# Patient Record
Sex: Female | Born: 1970 | Race: White | Smoking: Never smoker
Health system: Southern US, Community
[De-identification: ages and names within clinical notes are randomized; demographics above are authoritative.]

## PROBLEM LIST (undated history)

## (undated) DIAGNOSIS — R778 Other specified abnormalities of plasma proteins: Secondary | ICD-10-CM

## (undated) DIAGNOSIS — Z955 Presence of coronary angioplasty implant and graft: Secondary | ICD-10-CM

## (undated) DIAGNOSIS — R079 Chest pain, unspecified: Secondary | ICD-10-CM

## (undated) DIAGNOSIS — J45909 Unspecified asthma, uncomplicated: Secondary | ICD-10-CM

## (undated) DIAGNOSIS — R42 Dizziness and giddiness: Secondary | ICD-10-CM

## (undated) DIAGNOSIS — I2510 Atherosclerotic heart disease of native coronary artery without angina pectoris: Secondary | ICD-10-CM

## (undated) DIAGNOSIS — R0601 Orthopnea: Secondary | ICD-10-CM

## (undated) DIAGNOSIS — I252 Old myocardial infarction: Secondary | ICD-10-CM

## (undated) DIAGNOSIS — I219 Acute myocardial infarction, unspecified: Secondary | ICD-10-CM

## (undated) DIAGNOSIS — R0600 Dyspnea, unspecified: Secondary | ICD-10-CM

## (undated) DIAGNOSIS — B001 Herpesviral vesicular dermatitis: Secondary | ICD-10-CM

## (undated) DIAGNOSIS — I213 ST elevation (STEMI) myocardial infarction of unspecified site: Secondary | ICD-10-CM

## (undated) DIAGNOSIS — R600 Localized edema: Secondary | ICD-10-CM

## (undated) DIAGNOSIS — G43909 Migraine, unspecified, not intractable, without status migrainosus: Secondary | ICD-10-CM

## (undated) DIAGNOSIS — F329 Major depressive disorder, single episode, unspecified: Secondary | ICD-10-CM

## (undated) DIAGNOSIS — R7989 Other specified abnormal findings of blood chemistry: Secondary | ICD-10-CM

## (undated) DIAGNOSIS — O009 Unspecified ectopic pregnancy without intrauterine pregnancy: Secondary | ICD-10-CM

## (undated) DIAGNOSIS — R0602 Shortness of breath: Secondary | ICD-10-CM

## (undated) DIAGNOSIS — F32A Depression, unspecified: Secondary | ICD-10-CM

## (undated) HISTORY — DX: Atherosclerotic heart disease of native coronary artery without angina pectoris: I25.10

## (undated) HISTORY — PX: CORONARY ANGIOPLASTY WITH STENT PLACEMENT: SHX49

## (undated) HISTORY — DX: Dizziness and giddiness: R42

## (undated) HISTORY — DX: Other specified abnormal findings of blood chemistry: R79.89

## (undated) HISTORY — DX: Localized edema: R60.0

## (undated) HISTORY — DX: Shortness of breath: R06.02

## (undated) HISTORY — DX: Orthopnea: R06.01

## (undated) HISTORY — DX: Old myocardial infarction: I25.2

## (undated) HISTORY — DX: Presence of coronary angioplasty implant and graft: Z95.5

## (undated) HISTORY — DX: Other specified abnormalities of plasma proteins: R77.8

## (undated) HISTORY — DX: Dyspnea, unspecified: R06.00

## (undated) HISTORY — DX: Chest pain, unspecified: R07.9

## (undated) HISTORY — PX: CARDIAC CATHETERIZATION: SHX172

---
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
## 2015-07-13 DIAGNOSIS — I213 ST elevation (STEMI) myocardial infarction of unspecified site: Secondary | ICD-10-CM

## 2015-07-13 HISTORY — DX: ST elevation (STEMI) myocardial infarction of unspecified site: I21.3

## 2018-09-15 ENCOUNTER — Ambulatory Visit: Payer: Self-pay | Admitting: Physician Assistant

## 2018-09-15 VITALS — BP 112/72 | HR 83 | Temp 97.9°F | Resp 14 | Wt 158.8 lb

## 2018-09-15 DIAGNOSIS — J209 Acute bronchitis, unspecified: Secondary | ICD-10-CM

## 2018-09-15 DIAGNOSIS — J0100 Acute maxillary sinusitis, unspecified: Secondary | ICD-10-CM

## 2018-09-15 MED ORDER — PREDNISONE 20 MG PO TABS: 40.0000 mg | ORAL_TABLET | Freq: Every day | ORAL | 0 refills | Status: AC

## 2018-09-15 MED ORDER — BENZONATATE 100 MG PO CAPS: 100.0000 mg | ORAL_CAPSULE | Freq: Three times a day (TID) | ORAL | 0 refills | Status: DC | PRN

## 2018-09-15 MED ORDER — DOXYCYCLINE HYCLATE 100 MG PO TABS: 100.0000 mg | ORAL_TABLET | Freq: Two times a day (BID) | ORAL | 0 refills | Status: DC

## 2018-09-15 NOTE — Progress Notes (Signed)
MRN: [id number] DOB: 03/01/1971  Subjective:   Patricia Ali is a 48 y.o. female presenting for chief complaint of productive cough green mucous, sinus pressure and bilateral (x1 week (sudafed, theraflu and allegra)) .  Reports 1 week history of worsening illness. Has sinus pressure/pain, greenish-yellow nasal drainage, ear fullness, and cough.  Cough is mix of productive and dry.  Has had some wheezing intermittently-having to use her rescue inhaler more over the past week.  This provides relief.  Has some shortness of breath when she is coughing.  Has had intermittent fever and chills.  Denies myalgias, visual disturbance, confusion, gait abnormality, sore throat, inability to swallow and chest pain, nausea, vomiting, abdominal pain and diarrhea.  Has tried daily allergy medicine, asthma inhalers, real Sudafed, and TheraFlu.  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 Patient has had flu shot this season. Denies smoking.  Denies recent travel.   PMH of MI in 2016-no stents; asthma-on daily breo ellipta, rescue inhaler prn; and seasonal allergies-daily allegra and Astelin. Last hospitalized for asthma exacerbation >1 yr ago.  Denies PMH of diabetes, heart failure, and autoimmune disease.  PSH of uterine ablation. Denies any other aggravating or relieving factors, no other questions or concerns.  Review of Systems  Constitutional: Negative for diaphoresis.  Cardiovascular: Negative for palpitations and leg swelling.  Musculoskeletal: Negative for neck pain.  Skin: Negative for rash.  Neurological: Positive for headaches.    Jenevie has a current medication list which includes the following prescription(s): bupropion, fluticasone furoate-vilanterol, topiramate, and valacyclovir. Also is allergic to amoxicillin and augmentin [amoxicillin-pot clavulanate].  Tiawanna  has no past medical history on file. Also  has no past surgical history on file.   Objective:   Vitals: BP 112/72    Pulse 83   Temp 97.9 F (36.6 C)   Resp 14   Wt 158 lb 12.8 oz (72 kg)   SpO2 98%   Physical Exam Vitals signs reviewed.  Constitutional:      General: She is not in acute distress.    Appearance: She is well-developed. She is not toxic-appearing.  HENT:     Head: Normocephalic and atraumatic.     Right Ear: Ear canal and external ear normal. A middle ear effusion is present. Tympanic membrane is not erythematous or bulging.     Left Ear: Ear canal and external ear normal. A middle ear effusion is present. Tympanic membrane is not erythematous or bulging.     Nose: Mucosal edema (decreased nasal patancy b/l) and congestion present.     Right Sinus: Maxillary sinus tenderness present. No frontal sinus tenderness.     Left Sinus: Maxillary sinus tenderness present. No frontal sinus tenderness.     Mouth/Throat:     Lips: Pink.     Mouth: Mucous membranes are moist.     Pharynx: Uvula midline. Posterior oropharyngeal erythema present. No pharyngeal swelling, oropharyngeal exudate or uvula swelling.     Tonsils: No tonsillar exudate or tonsillar abscesses. Swelling: 1+ on the right. 1+ on the left.     Comments: Hoarse voice noted.  Eyes:     Extraocular Movements: Extraocular movements intact.     Conjunctiva/sclera: Conjunctivae normal.     Pupils: Pupils are equal, round, and reactive to light.  Neck:     Musculoskeletal: Normal range of motion.     Trachea: No abnormal tracheal secretions.  Cardiovascular:     Rate and Rhythm: Normal rate and regular rhythm.  Heart sounds: Murmur present. Systolic murmur present.  Pulmonary:     Effort: Pulmonary effort is normal. No tachypnea, accessory muscle usage, respiratory distress or retractions.     Breath sounds: No stridor. Wheezing (one faint wheeze w/ forced exp breathing in LLL) present. No decreased breath sounds, rhonchi or rales.  Musculoskeletal:     Right lower leg: She exhibits no swelling.     Left lower leg: She  exhibits no swelling.  Lymphadenopathy:     Head:     Right side of head: No submental, submandibular, tonsillar, preauricular, posterior auricular or occipital adenopathy.     Left side of head: No submental, submandibular, tonsillar, preauricular, posterior auricular or occipital adenopathy.     Cervical: No cervical adenopathy.     Upper Body:     Right upper body: No supraclavicular adenopathy.     Left upper body: No supraclavicular adenopathy.  Skin:    General: Skin is warm and dry.  Neurological:     Mental Status: She is alert.     No results found for this or any previous visit (from the past 24 hour(s)).  Assessment and Plan :  1. Acute maxillary sinusitis, recurrence not specified 2. Acute bronchitis with bronchospasm Patient is overall well-appearing, no acute distress.  VSS.  She is afebrile. No tachycardia.SPO2 98%. No signs of respiratory distress.  No tachypnea, stridor, nasal flaring, accessory muscle use, or retractions.  One faint wheeze auscultated on lung exam with forced expiratory breathing otherwise lung sounds were clear.  No crackles or rhonchi.  Has bilateral TTP sinuses and mucosal edema.  Due to history, duration, and no improvement with conservative management, would recommend treating with oral antibiotics at this time.  Will also provide prescription for prednisone due to history and physical exam findings. Would suggest she follow-up with primary care doctor if no improvement in the next 7 to 10 days with treatment plan.  Encouraged to seek care sooner if any of her current symptoms worsen with treatment plan or she develops new concerning symptoms.  Patient voices her understanding. - predniSONE (DELTASONE) 20 MG tablet; Take 2 tablets (40 mg total) by mouth daily with breakfast for 5 days.  Dispense: 10 tablet; Refill: 0 - doxycycline (VIBRA-TABS) 100 MG tablet; Take 1 tablet (100 mg total) by mouth 2 (two) times daily.  Dispense: 20 tablet; Refill: 0 -  benzonatate (TESSALON) 100 MG capsule; Take 1-2 capsules (100-200 mg total) by mouth 3 (three) times daily as needed for cough.  Dispense: 40 capsule; Refill: 0   Benjiman Core, Cordelia Poche  [facility] 09/15/2018 8:32 AM

## 2018-09-15 NOTE — Patient Instructions (Signed)
Thank you for choosing [facility] for your health care needs.   You have been diagnosed with bronchitis (chest cold) with sinusitis.   Due to duration, we are going to treat with oral antibiotics and also with prednisone.   While taking Doxycycline:  -Do not drink milk or take iron supplements, multivitamins, calcium supplements, antacids, laxatives within 2 hours before or after taking doxycycline. -Avoid direct exposure to sunlight or tanning beds. Doxycycline can make you sunburn more easily. Wear protective clothing and use sunscreen (SPF 30 or higher) when you are outdoors. -Antibiotic medicines can cause diarrhea, which may be a sign of a new infection. If you have diarrhea that is watery or bloody, stop taking this medicine and seek medical care.    Prednisone is a steroid and can cause side effects such as headache, irritability, nausea, vomiting, increased heart rate, increased blood pressure, increased blood sugar, appetite changes, and insomnia. Please take tablets in the morning with a full meal to help decrease the chances of these side effects.     Recommend increase fluids; water, Gatorade, or hot tea with water or lemon. May use humidifier or vaporizer in bedroom. Use several pillows to prop self up at night, may help with coughing and sleeping. Rest.   Use your rescue inhaler every 4-6 hours (when awake) for cough/wheezing.  Hope you feel better soon.   Follow-up with family physician or local urgent care if not seeing any improvement in the next 48 hours. Seek care sooner with any worsening symptoms.   Sinusitis, Adult Sinusitis is soreness and swelling (inflammation) of your sinuses. Sinuses are hollow spaces in the bones around your face. They are located:  Around your eyes.  In the middle of your forehead.  Behind your nose.  In your cheekbones. Your sinuses and nasal passages are lined with a fluid called mucus. Mucus drains out of your sinuses. Swelling can  trap mucus in your sinuses. This lets germs (bacteria, virus, or fungus) grow, which leads to infection. Most of the time, this condition is caused by a virus. What are the causes? This condition is caused by:  Allergies.  Asthma.  Germs.  Things that block your nose or sinuses.  Growths in the nose (nasal polyps).  Chemicals or irritants in the air.  Fungus (rare). What increases the risk? You are more likely to develop this condition if:  You have a weak body defense system (immune system).  You do a lot of swimming or diving.  You use nasal sprays too much.  You smoke. What are the signs or symptoms? The main symptoms of this condition are pain and a feeling of pressure around the sinuses. Other symptoms include:  Stuffy nose (congestion).  Runny nose (drainage).  Swelling and warmth in the sinuses.  Headache.  Toothache.  A cough that may get worse at night.  Mucus that collects in the throat or the back of the nose (postnasal drip).  Being unable to smell and taste.  Being very tired (fatigue).  A fever.  Sore throat.  Bad breath. How is this diagnosed? This condition is diagnosed based on:  Your symptoms.  Your medical history.  A physical exam.  Tests to find out if your condition is short-term (acute) or long-term (chronic). Your doctor may: ? Check your nose for growths (polyps). ? Check your sinuses using a tool that has a light (endoscope). ? Check for allergies or germs. ? Do imaging tests, such as an MRI or CT scan.  How is this treated? Treatment for this condition depends on the cause and whether it is short-term or long-term.  If caused by a virus, your symptoms should go away on their own within 10 days. You may be given medicines to relieve symptoms. They include: ? Medicines that shrink swollen tissue in the nose. ? Medicines that treat allergies (antihistamines). ? A spray that treats swelling of the nostrils. ? Rinses that  help get rid of thick mucus in your nose (nasal saline washes).  If caused by bacteria, your doctor may wait to see if you will get better without treatment. You may be given antibiotic medicine if you have: ? A very bad infection. ? A weak body defense system.  If caused by growths in the nose, you may need to have surgery. Follow these instructions at home: Medicines  Take, use, or apply over-the-counter and prescription medicines only as told by your doctor. These may include nasal sprays.  If you were prescribed an antibiotic medicine, take it as told by your doctor. Do not stop taking the antibiotic even if you start to feel better. Hydrate and humidify   Drink enough water to keep your pee (urine) pale yellow.  Use a cool mist humidifier to keep the humidity level in your home above 50%.  Breathe in steam for 10-15 minutes, 3-4 times a day, or as told by your doctor. You can do this in the bathroom while a hot shower is running.  Try not to spend time in cool or dry air. Rest  Rest as much as you can.  Sleep with your head raised (elevated).  Make sure you get enough sleep each night. General instructions   Put a warm, moist washcloth on your face 3-4 times a day, or as often as told by your doctor. This will help with discomfort.  Wash your hands often with soap and water. If there is no soap and water, use hand sanitizer.  Do not smoke. Avoid being around people who are smoking (secondhand smoke).  Keep all follow-up visits as told by your doctor. This is important. Contact a doctor if:  You have a fever.  Your symptoms get worse.  Your symptoms do not get better within 10 days. Get help right away if:  You have a very bad headache.  You cannot stop throwing up (vomiting).  You have very bad pain or swelling around your face or eyes.  You have trouble seeing.  You feel confused.  Your neck is stiff.  You have trouble  breathing. Summary  Sinusitis is swelling of your sinuses. Sinuses are hollow spaces in the bones around your face.  This condition is caused by tissues in your nose that become inflamed or swollen. This traps germs. These can lead to infection.  If you were prescribed an antibiotic medicine, take it as told by your doctor. Do not stop taking it even if you start to feel better.  Keep all follow-up visits as told by your doctor. This is important. This information is not intended to replace advice given to you by your health care provider. Make sure you discuss any questions you have with your health care provider. Document Released: 12/15/2007 Document Revised: 11/28/2017 Document Reviewed: 11/28/2017 Elsevier Interactive Patient Education  2019 [employer]   Acute Bronchitis, Adult Acute bronchitis is when air tubes (bronchi) in the lungs suddenly get swollen. The condition can make it hard to breathe. It can also cause these symptoms:  A cough.  Coughing up clear, yellow, or green mucus.  Wheezing.  Chest congestion.  Shortness of breath.  A fever.  Body aches.  Chills.  A sore throat. Follow these instructions at home:  Medicines  Take over-the-counter and prescription medicines only as told by your doctor.  If you were prescribed an antibiotic medicine, take it as told by your doctor. Do not stop taking the antibiotic even if you start to feel better. General instructions  Rest.  Drink enough fluids to keep your pee (urine) pale yellow.  Avoid smoking and secondhand smoke. If you smoke and you need help quitting, ask your doctor. Quitting will help your lungs heal faster.  Use an inhaler, cool mist vaporizer, or humidifier as told by your doctor.  Keep all follow-up visits as told by your doctor. This is important. How is this prevented? To lower your risk of getting this condition again:  Wash your hands often with soap and water. If you cannot use  soap and water, use hand sanitizer.  Avoid contact with people who have cold symptoms.  Try not to touch your hands to your mouth, nose, or eyes.  Make sure to get the flu shot every year. Contact a doctor if:  Your symptoms do not get better in 2 weeks. Get help right away if:  You cough up blood.  You have chest pain.  You have very bad shortness of breath.  You become dehydrated.  You faint (pass out) or keep feeling like you are going to pass out.  You keep throwing up (vomiting).  You have a very bad headache.  Your fever or chills gets worse. This information is not intended to replace advice given to you by your health care provider. Make sure you discuss any questions you have with your health care provider. Document Released: 12/15/2007 Document Revised: 02/09/2017 Document Reviewed: 12/17/2015 Elsevier Interactive Patient Education  2019 [employer].

## 2019-02-17 ENCOUNTER — Other Ambulatory Visit: Payer: Self-pay

## 2019-02-17 ENCOUNTER — Emergency Department (HOSPITAL_COMMUNITY): Payer: BC Managed Care – PPO

## 2019-02-17 ENCOUNTER — Encounter (HOSPITAL_COMMUNITY): Payer: Self-pay

## 2019-02-17 ENCOUNTER — Observation Stay (HOSPITAL_COMMUNITY)
Admission: EM | Admit: 2019-02-17 | Discharge: 2019-02-18 | Disposition: A | Discharge status: Home / Self Care | Payer: BC Managed Care – PPO | Attending: Family Medicine | Admitting: Family Medicine

## 2019-02-17 DIAGNOSIS — G43909 Migraine, unspecified, not intractable, without status migrainosus: Secondary | ICD-10-CM | POA: Diagnosis not present

## 2019-02-17 DIAGNOSIS — I2510 Atherosclerotic heart disease of native coronary artery without angina pectoris: Secondary | ICD-10-CM | POA: Diagnosis not present

## 2019-02-17 DIAGNOSIS — Z20828 Contact with and (suspected) exposure to other viral communicable diseases: Secondary | ICD-10-CM | POA: Diagnosis not present

## 2019-02-17 DIAGNOSIS — R079 Chest pain, unspecified: Secondary | ICD-10-CM | POA: Diagnosis present

## 2019-02-17 DIAGNOSIS — R200 Anesthesia of skin: Secondary | ICD-10-CM | POA: Diagnosis not present

## 2019-02-17 DIAGNOSIS — F32A Depression, unspecified: Secondary | ICD-10-CM | POA: Diagnosis present

## 2019-02-17 DIAGNOSIS — Z79899 Other long term (current) drug therapy: Secondary | ICD-10-CM | POA: Insufficient documentation

## 2019-02-17 DIAGNOSIS — R7309 Other abnormal glucose: Secondary | ICD-10-CM | POA: Diagnosis not present

## 2019-02-17 DIAGNOSIS — F329 Major depressive disorder, single episode, unspecified: Secondary | ICD-10-CM | POA: Insufficient documentation

## 2019-02-17 DIAGNOSIS — R0789 Other chest pain: Principal | ICD-10-CM | POA: Insufficient documentation

## 2019-02-17 DIAGNOSIS — J45909 Unspecified asthma, uncomplicated: Secondary | ICD-10-CM | POA: Diagnosis present

## 2019-02-17 HISTORY — DX: Acute myocardial infarction, unspecified: I21.9

## 2019-02-17 HISTORY — DX: Unspecified ectopic pregnancy without intrauterine pregnancy: O00.90

## 2019-02-17 HISTORY — DX: Depression, unspecified: F32.A

## 2019-02-17 HISTORY — DX: Herpesviral vesicular dermatitis: B00.1

## 2019-02-17 HISTORY — DX: Unspecified asthma, uncomplicated: J45.909

## 2019-02-17 HISTORY — DX: Migraine, unspecified, not intractable, without status migrainosus: G43.909

## 2019-02-17 LAB — CBC
HCT: 39.2 % (ref 36.0–46.0)
Hemoglobin: 13.9 g/dL (ref 12.0–15.0)
MCH: 32.3 pg (ref 26.0–34.0)
MCHC: 35.5 g/dL (ref 30.0–36.0)
MCV: 91 fL (ref 80.0–100.0)
Platelets: 208 10*3/uL (ref 150–400)
RBC: 4.31 MIL/uL (ref 3.87–5.11)
RDW: 12.3 % (ref 11.5–15.5)
WBC: 7.8 10*3/uL (ref 4.0–10.5)
nRBC: 0 % (ref 0.0–0.2)

## 2019-02-17 LAB — BASIC METABOLIC PANEL
Anion gap: 11 (ref 5–15)
BUN: 24 mg/dL — ABNORMAL HIGH (ref 6–20)
CO2: 24 mmol/L (ref 22–32)
Calcium: 9.3 mg/dL (ref 8.9–10.3)
Chloride: 105 mmol/L (ref 98–111)
Creatinine, Ser: 1.1 mg/dL — ABNORMAL HIGH (ref 0.44–1.00)
GFR calc Af Amer: >60 mL/min (ref >60)
GFR calc non Af Amer: 58 mL/min — ABNORMAL LOW (ref >60)
Glucose, Bld: 151 mg/dL — ABNORMAL HIGH (ref 70–99)
Potassium: 3.9 mmol/L (ref 3.5–5.1)
Sodium: 140 mmol/L (ref 135–145)

## 2019-02-17 LAB — TROPONIN I (HIGH SENSITIVITY)
Troponin I (High Sensitivity): 3 ng/L (ref <18)
Troponin I (High Sensitivity): 3 ng/L (ref <18)

## 2019-02-17 LAB — I-STAT BETA HCG BLOOD, ED (MC, WL, AP ONLY): I-stat hCG, quantitative: <5 m[IU]/mL (ref <5)

## 2019-02-17 MED ORDER — SODIUM CHLORIDE 0.9% FLUSH: 3.0000 mL | Freq: Once | INTRAVENOUS | Status: DC

## 2019-02-17 NOTE — ED Triage Notes (Signed)
Pt comes for CP that started today that woke her from her sleep, radiation to R arm with nausea and headache, pt reports got worse about an hour ago, reports pain now radiates to arm and is now numb and tingling in R arm. Neuro intact bilaterally. Hx of MI

## 2019-02-17 NOTE — ED Notes (Signed)
Pt taken back to obtain 2nd trop.

## 2019-02-17 NOTE — ED Notes (Signed)
Spoke with MD Alvino Chapel about R arm numbness and tingling, no head CT at this time

## 2019-02-18 ENCOUNTER — Observation Stay (HOSPITAL_BASED_OUTPATIENT_CLINIC_OR_DEPARTMENT_OTHER): Payer: BC Managed Care – PPO

## 2019-02-18 ENCOUNTER — Other Ambulatory Visit: Payer: Self-pay

## 2019-02-18 ENCOUNTER — Encounter (HOSPITAL_COMMUNITY): Payer: Self-pay | Admitting: Internal Medicine

## 2019-02-18 ENCOUNTER — Other Ambulatory Visit: Payer: Self-pay | Admitting: Student

## 2019-02-18 DIAGNOSIS — R202 Paresthesia of skin: Secondary | ICD-10-CM

## 2019-02-18 DIAGNOSIS — R079 Chest pain, unspecified: Secondary | ICD-10-CM | POA: Diagnosis not present

## 2019-02-18 DIAGNOSIS — J4520 Mild intermittent asthma, uncomplicated: Secondary | ICD-10-CM | POA: Diagnosis not present

## 2019-02-18 DIAGNOSIS — F32A Depression, unspecified: Secondary | ICD-10-CM | POA: Diagnosis present

## 2019-02-18 DIAGNOSIS — G43909 Migraine, unspecified, not intractable, without status migrainosus: Secondary | ICD-10-CM | POA: Diagnosis present

## 2019-02-18 DIAGNOSIS — F329 Major depressive disorder, single episode, unspecified: Secondary | ICD-10-CM | POA: Diagnosis present

## 2019-02-18 DIAGNOSIS — Z20828 Contact with and (suspected) exposure to other viral communicable diseases: Secondary | ICD-10-CM | POA: Diagnosis not present

## 2019-02-18 DIAGNOSIS — R0789 Other chest pain: Secondary | ICD-10-CM | POA: Diagnosis not present

## 2019-02-18 DIAGNOSIS — R200 Anesthesia of skin: Secondary | ICD-10-CM | POA: Diagnosis present

## 2019-02-18 DIAGNOSIS — J45909 Unspecified asthma, uncomplicated: Secondary | ICD-10-CM | POA: Diagnosis not present

## 2019-02-18 DIAGNOSIS — I2510 Atherosclerotic heart disease of native coronary artery without angina pectoris: Secondary | ICD-10-CM | POA: Diagnosis not present

## 2019-02-18 LAB — HIV ANTIBODY (ROUTINE TESTING W REFLEX): HIV Screen 4th Generation wRfx: NONREACTIVE

## 2019-02-18 LAB — NM MYOCAR MULTI W/SPECT W/WALL MOTION / EF
Estimated workload: 1 METS
Exercise duration (min): 0 min
Exercise duration (sec): 0 s
MPHR: 170 {beats}/min
Peak HR: 85 {beats}/min
Percent HR: 50 %
Rest HR: 52 {beats}/min

## 2019-02-18 LAB — TROPONIN I (HIGH SENSITIVITY)
Troponin I (High Sensitivity): 4 ng/L (ref <18)
Troponin I (High Sensitivity): 4 ng/L (ref <18)
Troponin I (High Sensitivity): 3 ng/L (ref <18)

## 2019-02-18 LAB — RAPID URINE DRUG SCREEN, HOSP PERFORMED
Amphetamines: NOT DETECTED
Barbiturates: NOT DETECTED
Benzodiazepines: NOT DETECTED
Cocaine: NOT DETECTED
Opiates: POSITIVE — AB
Tetrahydrocannabinol: NOT DETECTED

## 2019-02-18 LAB — LIPID PANEL
Cholesterol: 257 mg/dL — ABNORMAL HIGH (ref 0–200)
HDL: 51 mg/dL (ref >40)
LDL Cholesterol: 179 mg/dL — ABNORMAL HIGH (ref 0–99)
Total CHOL/HDL Ratio: 5 RATIO
Triglycerides: 133 mg/dL (ref <150)
VLDL: 27 mg/dL (ref 0–40)

## 2019-02-18 LAB — HEMOGLOBIN A1C
Hgb A1c MFr Bld: 6 % — ABNORMAL HIGH (ref 4.8–5.6)
Mean Plasma Glucose: 125.5 mg/dL

## 2019-02-18 LAB — D-DIMER, QUANTITATIVE: D-Dimer, Quant: <0.27 ug/mL-FEU (ref 0.00–0.50)

## 2019-02-18 LAB — SARS CORONAVIRUS 2 BY RT PCR (HOSPITAL ORDER, PERFORMED IN ~~LOC~~ HOSPITAL LAB): SARS Coronavirus 2: NEGATIVE

## 2019-02-18 MED ORDER — MORPHINE SULFATE (PF) 2 MG/ML IV SOLN
2.0000 mg | INTRAVENOUS | Status: DC | PRN
  Administered 2019-02-18 (×2): 2 mg via INTRAVENOUS
  Filled 2019-02-18 (×2): qty 1

## 2019-02-18 MED ORDER — ASPIRIN 81 MG PO CHEW
324.0000 mg | CHEWABLE_TABLET | Freq: Once | ORAL | Status: AC
  Administered 2019-02-18: 324 mg via ORAL
  Filled 2019-02-18: qty 4

## 2019-02-18 MED ORDER — ESCITALOPRAM OXALATE 10 MG PO TABS
20.0000 mg | ORAL_TABLET | Freq: Every day | ORAL | Status: DC
  Administered 2019-02-18: 20 mg via ORAL
  Filled 2019-02-18: qty 2

## 2019-02-18 MED ORDER — NITROGLYCERIN 0.4 MG SL SUBL: 0.4000 mg | SUBLINGUAL_TABLET | SUBLINGUAL | 0 refills | Status: AC | PRN

## 2019-02-18 MED ORDER — NITROGLYCERIN 0.4 MG SL SUBL
0.4000 mg | SUBLINGUAL_TABLET | SUBLINGUAL | Status: DC | PRN
  Administered 2019-02-18 (×2): 0.4 mg via SUBLINGUAL
  Filled 2019-02-18: qty 1

## 2019-02-18 MED ORDER — MOMETASONE FURO-FORMOTEROL FUM 200-5 MCG/ACT IN AERO
2.0000 | INHALATION_SPRAY | Freq: Two times a day (BID) | RESPIRATORY_TRACT | Status: DC
  Filled 2019-02-18: qty 8.8

## 2019-02-18 MED ORDER — TECHNETIUM TC 99M TETROFOSMIN IV KIT
30.0000 | PACK | Freq: Once | INTRAVENOUS | Status: AC | PRN
  Administered 2019-02-18: 30 via INTRAVENOUS

## 2019-02-18 MED ORDER — KETOROLAC TROMETHAMINE 30 MG/ML IJ SOLN
30.0000 mg | Freq: Once | INTRAMUSCULAR | Status: AC
  Administered 2019-02-18: 30 mg via INTRAVENOUS
  Filled 2019-02-18: qty 1

## 2019-02-18 MED ORDER — ALBUTEROL SULFATE (2.5 MG/3ML) 0.083% IN NEBU: 3.0000 mL | INHALATION_SOLUTION | Freq: Four times a day (QID) | RESPIRATORY_TRACT | Status: DC | PRN

## 2019-02-18 MED ORDER — VALACYCLOVIR HCL 500 MG PO TABS
500.0000 mg | ORAL_TABLET | Freq: Every day | ORAL | Status: DC
  Administered 2019-02-18: 500 mg via ORAL
  Filled 2019-02-18: qty 1

## 2019-02-18 MED ORDER — ENOXAPARIN SODIUM 40 MG/0.4ML ~~LOC~~ SOLN
40.0000 mg | SUBCUTANEOUS | Status: DC
  Administered 2019-02-18: 40 mg via SUBCUTANEOUS
  Filled 2019-02-18: qty 0.4

## 2019-02-18 MED ORDER — MORPHINE SULFATE (PF) 4 MG/ML IV SOLN
4.0000 mg | Freq: Once | INTRAVENOUS | Status: AC
  Administered 2019-02-18: 4 mg via INTRAVENOUS
  Filled 2019-02-18: qty 1

## 2019-02-18 MED ORDER — ACETAMINOPHEN 325 MG PO TABS: 650.0000 mg | ORAL_TABLET | ORAL | Status: DC | PRN

## 2019-02-18 MED ORDER — TECHNETIUM TC 99M TETROFOSMIN IV KIT
10.0000 | PACK | Freq: Once | INTRAVENOUS | Status: AC | PRN
  Administered 2019-02-18: 10 via INTRAVENOUS

## 2019-02-18 MED ORDER — ASPIRIN 81 MG PO CHEW
324.0000 mg | CHEWABLE_TABLET | Freq: Every day | ORAL | Status: DC
  Administered 2019-02-18: 324 mg via ORAL
  Filled 2019-02-18 (×2): qty 4

## 2019-02-18 MED ORDER — REGADENOSON 0.4 MG/5ML IV SOLN
INTRAVENOUS | Status: AC
  Filled 2019-02-18: qty 5

## 2019-02-18 MED ORDER — REGADENOSON 0.4 MG/5ML IV SOLN
0.4000 mg | Freq: Once | INTRAVENOUS | Status: AC
  Administered 2019-02-18: 0.4 mg via INTRAVENOUS
  Filled 2019-02-18: qty 5

## 2019-02-18 MED ORDER — ONDANSETRON HCL 4 MG/2ML IJ SOLN: 4.0000 mg | Freq: Four times a day (QID) | INTRAMUSCULAR | Status: DC | PRN

## 2019-02-18 MED ORDER — SODIUM CHLORIDE 0.9 % IV SOLN
INTRAVENOUS | Status: DC
  Administered 2019-02-18: 05:00:00 via INTRAVENOUS

## 2019-02-18 MED ORDER — REGADENOSON 0.4 MG/5ML IV SOLN
0.4000 mg | Freq: Once | INTRAVENOUS | Status: DC
  Filled 2019-02-18: qty 5

## 2019-02-18 MED ORDER — TOPIRAMATE 25 MG PO TABS
50.0000 mg | ORAL_TABLET | Freq: Two times a day (BID) | ORAL | Status: DC
  Administered 2019-02-18: 50 mg via ORAL
  Filled 2019-02-18 (×3): qty 2

## 2019-02-18 NOTE — Progress Notes (Signed)
   Patient presented for a nuclear stress test today. No immediate complications. Stress imaging is pending at this time.   Preliminary EKG findings may be listed in the chart, but the stress test result will not be finalized until perfusion imaging is complete.  Darreld Mclean, PA-C 02/18/2019 10:04 AM

## 2019-02-18 NOTE — Plan of Care (Signed)
  Problem: Clinical Measurements: Goal: Ability to maintain clinical measurements within normal limits will improve Outcome: Progressing Goal: Diagnostic test results will improve Outcome: Completed/Met

## 2019-02-18 NOTE — Discharge Instructions (Signed)
Patricia Ali,  You were in the hospital with chest pain. You received testing and exams that were low risk for you having a heart attack. Please follow-up with the cardiologist as recommended.  Heart-Healthy Eating Plan Heart-healthy meal planning includes:  Eating less unhealthy fats.  Eating more healthy fats.  Making other changes in your diet. Talk with your doctor or a diet specialist (dietitian) to create an eating plan that is right for you. What is my plan? Your doctor may recommend an eating plan that includes:  Total fat: ______% or less of total calories a day.  Saturated fat: ______% or less of total calories a day.  Cholesterol: less than _________mg a day. What are tips for following this plan? Cooking Avoid frying your food. Try to bake, boil, grill, or broil it instead. You can also reduce fat by:  Removing the skin from poultry.  Removing all visible fats from meats.  Steaming vegetables in water or broth. Meal planning   At meals, divide your plate into four equal parts: ? Fill one-half of your plate with vegetables and green salads. ? Fill one-fourth of your plate with whole grains. ? Fill one-fourth of your plate with lean protein foods.  Eat 4-5 servings of vegetables per day. A serving of vegetables is: ? 1 cup of raw or cooked vegetables. ? 2 cups of raw leafy greens.  Eat 4-5 servings of fruit per day. A serving of fruit is: ? 1 medium whole fruit. ?  cup of dried fruit. ?  cup of fresh, frozen, or canned fruit. ?  cup of 100% fruit juice.  Eat more foods that have soluble fiber. These are apples, broccoli, carrots, beans, peas, and barley. Try to get 20-30 g of fiber per day.  Eat 4-5 servings of nuts, legumes, and seeds per week: ? 1 serving of dried beans or legumes equals  cup after being cooked. ? 1 serving of nuts is  cup. ? 1 serving of seeds equals 1 tablespoon. General information  Eat more home-cooked food. Eat less  restaurant, buffet, and fast food.  Limit or avoid alcohol.  Limit foods that are high in starch and sugar.  Avoid fried foods.  Lose weight if you are overweight.  Keep track of how much salt (sodium) you eat. This is important if you have high blood pressure. Ask your doctor to tell you more about this.  Try to add vegetarian meals each week. Fats  Choose healthy fats. These include olive oil and canola oil, flaxseeds, walnuts, almonds, and seeds.  Eat more omega-3 fats. These include salmon, mackerel, sardines, tuna, flaxseed oil, and ground flaxseeds. Try to eat fish at least 2 times each week.  Check food labels. Avoid foods with trans fats or high amounts of saturated fat.  Limit saturated fats. ? These are often found in animal products, such as meats, butter, and cream. ? These are also found in plant foods, such as palm oil, palm kernel oil, and coconut oil.  Avoid foods with partially hydrogenated oils in them. These have trans fats. Examples are stick margarine, some tub margarines, cookies, crackers, and other baked goods. What foods can I eat? Fruits All fresh, canned (in natural juice), or frozen fruits. Vegetables Fresh or frozen vegetables (raw, steamed, roasted, or grilled). Green salads. Grains Most grains. Choose whole wheat and whole grains most of the time. Rice and pasta, including brown rice and pastas made with whole wheat. Meats and other proteins Lean, well-trimmed beef,  veal, pork, and lamb. Chicken and [place] without skin. All fish and shellfish. Wild duck, rabbit, pheasant, and venison. Egg whites or low-cholesterol egg substitutes. Dried beans, peas, lentils, and tofu. Seeds and most nuts. Dairy Low-fat or nonfat cheeses, including ricotta and mozzarella. Skim or 1% milk that is liquid, powdered, or evaporated. Buttermilk that is made with low-fat milk. Nonfat or low-fat yogurt. Fats and oils Non-hydrogenated (trans-free) margarines. Vegetable oils,  including soybean, sesame, sunflower, olive, peanut, safflower, corn, canola, and cottonseed. Salad dressings or mayonnaise made with a vegetable oil. Beverages Mineral water. Coffee and tea. Diet carbonated beverages. Sweets and desserts Sherbet, gelatin, and fruit ice. Small amounts of dark chocolate. Limit all sweets and desserts. Seasonings and condiments All seasonings and condiments. The items listed above may not be a complete list of foods and drinks you can eat. Contact a dietitian for more options. What foods should I avoid? Fruits Canned fruit in heavy syrup. Fruit in cream or butter sauce. Fried fruit. Limit coconut. Vegetables Vegetables cooked in cheese, cream, or butter sauce. Fried vegetables. Grains Breads that are made with saturated or trans fats, oils, or whole milk. Croissants. Sweet rolls. Donuts. High-fat crackers, such as cheese crackers. Meats and other proteins Fatty meats, such as hot dogs, ribs, sausage, bacon, rib-eye roast or steak. High-fat deli meats, such as salami and bologna. Caviar. Domestic duck and goose. Organ meats, such as liver. Dairy Cream, sour cream, cream cheese, and creamed cottage cheese. Whole-milk cheeses. Whole or 2% milk that is liquid, evaporated, or condensed. Whole buttermilk. Cream sauce or high-fat cheese sauce. Yogurt that is made from whole milk. Fats and oils Meat fat, or shortening. Cocoa butter, hydrogenated oils, palm oil, coconut oil, palm kernel oil. Solid fats and shortenings, including bacon fat, salt pork, lard, and butter. Nondairy cream substitutes. Salad dressings with cheese or sour cream. Beverages Regular sodas and juice drinks with added sugar. Sweets and desserts Frosting. Pudding. Cookies. Cakes. Pies. Milk chocolate or white chocolate. Buttered syrups. Full-fat ice cream or ice cream drinks. The items listed above may not be a complete list of foods and drinks to avoid. Contact a dietitian for more  information. Summary  Heart-healthy meal planning includes eating less unhealthy fats, eating more healthy fats, and making other changes in your diet.  Eat a balanced diet. This includes fruits and vegetables, low-fat or nonfat dairy, lean protein, nuts and legumes, whole grains, and heart-healthy oils and fats. This information is not intended to replace advice given to you by your health care provider. Make sure you discuss any questions you have with your health care provider. Document Released: 12/28/2011 Document Revised: 09/01/2017 Document Reviewed: 08/05/2017 Elsevier Patient Education  2020 [employer]

## 2019-02-18 NOTE — Discharge Summary (Signed)
Physician Discharge Summary  Patricia Ali ONG:[id number] DOB: May 03, 1971 DOA: 02/17/2019  PCP: Lewis Moccasin, MD  Admit date: 02/17/2019 Discharge date: 02/18/2019  Admitted From: Home Disposition: Home  Recommendations for Outpatient Follow-up:  1. Follow up with PCP in 1 week 2. Follow up with Dr. [place] Emms 3. Outpatient Transthoracic Echocardiogram 4. Please obtain BMP/CBC in one week 5. Please follow up on the following pending results: None  Home Health: None Equipment/Devices: None  Discharge Condition: Stable CODE STATUS: Full code Diet recommendation: Heart healthy   Brief/Interim Summary:  Admission HPI written by Lorretta Harp, MD   Chief Complaint: Chest pain  HPI: Patricia Ali is a 48 y.o. female with medical history significant of CAD and MI 4 years ago (no stent placement), migraine headache, asthma, depression, who presents with chest pain.  Patient states that she was waken up by chest pain at about 3 AM.  The chest pain is located in the right upper chest, initially 9 out of 10 in severity, currently 5 out of 10 in severity, sharp, pleuritic, aggravated by deep breath, radiating to the left arm, associated with diaphoresis.  Patient does not have cough, shortness breath, fever or chills.  No recent long distant traveling.  No tenderness in the calf areas.  Patient states that initially her chest pain was radiating to right arm, causing right arm tingling, which has resolved.  Currently patient does not have any tingling, numbness or weakness in the right arm.  No facial droop or slurred speech.  No vision change or hearing loss.  Patient has  nausea, but no vomiting, diarrhea, abdominal pain.  No symptoms of UTI.  She states that she is taking Valtrex for nose cold sore chronically.  ED Course: pt was found to have negative troponin x2, WBC 7.8, negative pregnancy test, pending COVID-19 test, creatinine 1.10, BUN 24, temperature normal, blood pressure 146/58, heart  rate 52, oxygen saturation 98% on room air, chest x-ray negative.  Patient is placed on telemetry bed for observation.    Hospital course:  Chest pain History of CAD with reported MI in the past. Has had a heart catheterization without PCI. Pain is somewhat reproducible but similar to pain from previous MI. Improved with nitro and morphine. Hs troponin have been negative and flat. No evidence of ACS on repeat EKG, even during episode of chest pain. Cardiology ordered nuclear stress test with was low risk. Recommendations for outpatient follow-up with outpatient Transthoracic Echocardiogram. Discharged with a nitroglycerin prescription.  Discharge Diagnoses:  Principal Problem:   Chest pain Active Problems:   Asthma   CAD (coronary artery disease)   Depression   Numbness and tingling of right arm   Migraine    Discharge Instructions  Discharge Instructions    Diet - low sodium heart healthy   Complete by: As directed    Increase activity slowly   Complete by: As directed      Allergies as of 02/18/2019      Reactions   Amoxicillin Diarrhea   Did it involve swelling of the face/tongue/throat, SOB, or low BP? No Did it involve sudden or severe rash/hives, skin peeling, or any reaction on the inside of your mouth or nose? No Did you need to seek medical attention at a hospital or doctor's office? No When did it last happen? approx 3 yrs ago If all above answers are NO, may proceed with cephalosporin use.   Augmentin [amoxicillin-pot Clavulanate] Diarrhea   Did it involve swelling of  the face/tongue/throat, SOB, or low BP? No Did it involve sudden or severe rash/hives, skin peeling, or any reaction on the inside of your mouth or nose? No Did you need to seek medical attention at a hospital or doctor's office? No When did it last happen? approx 3 yrs ago If all above answers are "NO", may proceed with cephalosporin use.      Medication List    TAKE these  medications   albuterol 108 (90 Base) MCG/ACT inhaler Commonly known as: VENTOLIN HFA Inhale 1-2 puffs into the lungs every 6 (six) hours as needed for wheezing or shortness of breath.   budesonide-formoterol 160-4.5 MCG/ACT inhaler Commonly known as: SYMBICORT Inhale 2 puffs into the lungs daily.   escitalopram 20 MG tablet Commonly known as: LEXAPRO Take 20 mg by mouth daily.   nitroGLYCERIN 0.4 MG SL tablet Commonly known as: NITROSTAT Place 1 tablet (0.4 mg total) under the tongue every 5 (five) minutes as needed for chest pain (Up to twice before requesting medical help).   topiramate 50 MG tablet Commonly known as: TOPAMAX Take 50 mg by mouth 2 (two) times daily.   Valtrex 1000 MG tablet Generic drug: valACYclovir Take 500 mg by mouth daily.      Follow-up Information    [facility] Follow up.   Specialty: Cardiology Why: Our office will call you to schedule outpatient echocardiogram and follow-up visit.  Contact information: [address] [place] [id number] [phone]         Allergies  Allergen Reactions   Amoxicillin Diarrhea    Did it involve swelling of the face/tongue/throat, SOB, or low BP? No Did it involve sudden or severe rash/hives, skin peeling, or any reaction on the inside of your mouth or nose? No Did you need to seek medical attention at a hospital or doctor's office? No When did it last happen? approx 3 yrs ago If all above answers are NO, may proceed with cephalosporin use.    Augmentin [Amoxicillin-Pot Clavulanate] Diarrhea    Did it involve swelling of the face/tongue/throat, SOB, or low BP? No Did it involve sudden or severe rash/hives, skin peeling, or any reaction on the inside of your mouth or nose? No Did you need to seek medical attention at a hospital or doctor's office? No When did it last happen? approx 3 yrs ago If all above answers are "NO", may proceed with  cephalosporin use.    Consultations:  Cardiology   Procedures/Studies: Dg Chest 2 View  Result Date: 02/17/2019 CLINICAL DATA:  Chest pain EXAM: CHEST - 2 VIEW COMPARISON:  None. FINDINGS: The heart size and mediastinal contours are within normal limits. Both lungs are clear. The visualized skeletal structures are unremarkable. IMPRESSION: No active cardiopulmonary disease. Electronically Signed   By: Alcide CleverMark  Lukens M.D.   On: 02/17/2019 20:21   Nm Myocar Multi W/spect W/wall Motion / Ef  Result Date: 02/18/2019  T wave inversion of 1 mm was noted during stress in the III leads.  The study is normal.  This is a low risk study.  The left ventricular ejection fraction is mildly decreased (45-54%).  Normal resting and stress perfusion. No ischemia or infarction EF Estimated 50% but looks normal Can consider outpatient echo to assess EF with another modality     Subjective: Chest pain this morning. Relieved with nitro and morphine  Discharge Exam: Vitals:   02/18/19 0937 02/18/19 1344  BP: 116/73 121/61  Pulse:  (!) 51  Resp:  19  Temp:  (!) 97.5 F (36.4 C)  SpO2:  100%   Vitals:   02/18/19 0934 02/18/19 0935 02/18/19 0937 02/18/19 1344  BP: 132/70 125/73 116/73 121/61  Pulse:    (!) 51  Resp:    19  Temp:    (!) 97.5 F (36.4 C)  TempSrc:    Oral  SpO2:    100%  Weight:      Height:        General: Pt is alert, awake, not in acute distress Cardiovascular: RRR, S1/S2 +, no rubs, no gallops Respiratory: CTA bilaterally, no wheezing, no rhonchi Abdominal: Soft, NT, ND, bowel sounds + Extremities: no edema, no cyanosis    The results of significant diagnostics from this hospitalization (including imaging, microbiology, ancillary and laboratory) are listed below for reference.     Microbiology: Recent Results (from the past 240 hour(s))  SARS Coronavirus 2 [facility](Hospital order, Performed in [facility] hospital lab) Nasopharyngeal Nasopharyngeal Swab     Status: None    Collection Time: 02/18/19  2:48 AM   Specimen: Nasopharyngeal Swab  Result Value Ref Range Status   SARS Coronavirus 2 NEGATIVE NEGATIVE Final    Comment: (NOTE) If result is NEGATIVE SARS-CoV-2 target nucleic acids are NOT DETECTED. The SARS-CoV-2 RNA is generally detectable in upper and lower  respiratory specimens during the acute phase of infection. The lowest  concentration of SARS-CoV-2 viral copies this assay can detect is 250  copies / mL. A negative result does not preclude SARS-CoV-2 infection  and should not be used as the sole basis for treatment or other  patient management decisions.  A negative result may occur with  improper specimen collection / handling, submission of specimen other  than nasopharyngeal swab, presence of viral mutation(s) within the  areas targeted by this assay, and inadequate number of viral copies  (<250 copies / mL). A negative result must be combined with clinical  observations, patient history, and epidemiological information. If result is POSITIVE SARS-CoV-2 target nucleic acids are DETECTED. The SARS-CoV-2 RNA is generally detectable in upper and lower  respiratory specimens dur ing the acute phase of infection.  Positive  results are indicative of active infection with SARS-CoV-2.  Clinical  correlation with patient history and other diagnostic information is  necessary to determine patient infection status.  Positive results do  not rule out bacterial infection or co-infection with other viruses. If result is PRESUMPTIVE POSTIVE SARS-CoV-2 nucleic acids MAY BE PRESENT.   A presumptive positive result was obtained on the submitted specimen  and confirmed on repeat testing.  While 2019 novel coronavirus  (SARS-CoV-2) nucleic acids may be present in the submitted sample  additional confirmatory testing may be necessary for epidemiological  and / or clinical management purposes  to differentiate between  SARS-CoV-2 and other Sarbecovirus  currently known to infect humans.  If clinically indicated additional testing with an alternate test  methodology [phone]([id number]) is advised. The SARS-CoV-2 RNA is generally  detectable in upper and lower respiratory sp ecimens during the acute  phase of infection. The expected result is Negative. Fact Sheet for Patients:  BoilerBrush.com.cyhttps://www.fda.gov/media/136312/download Fact Sheet for Healthcare Providers: https://pope.com/https://www.fda.gov/media/136313/download This test is not yet approved or cleared by the [place] FDA and has been authorized for detection and/or diagnosis of SARS-CoV-2 by FDA under an Emergency Use Authorization (EUA).  This EUA will remain in effect (meaning this test can be used) for the duration of the COVID-19 declaration under Section 564(b)(1) of  the Act, 21 U.S.C. section 360bbb-3(b)(1), unless the authorization is terminated or revoked sooner. Performed at [facility], [place] [address], [place], [place] [address]      Labs: BNP (last 3 results) No results for input(s): BNP in the last 8760 hours. Basic Metabolic Panel: Recent Labs  Lab 02/17/19 2001  NA 140  K 3.9  CL 105  CO2 24  GLUCOSE 151*  BUN 24*  CREATININE 1.10*  CALCIUM 9.3   Liver Function Tests: No results for input(s): AST, ALT, ALKPHOS, BILITOT, PROT, ALBUMIN in the last 168 hours. No results for input(s): LIPASE, AMYLASE in the last 168 hours. No results for input(s): AMMONIA in the last 168 hours. CBC: Recent Labs  Lab 02/17/19 2001  WBC 7.8  HGB 13.9  HCT 39.2  MCV 91.0  PLT 208   Cardiac Enzymes: No results for input(s): CKTOTAL, CKMB, CKMBINDEX, TROPONINI in the last 168 hours. BNP: Invalid input(s): POCBNP CBG: No results for input(s): GLUCAP in the last 168 hours. D-Dimer Recent Labs    02/18/19 0314  DDIMER <0.27   Hgb A1c Recent Labs    02/18/19 1021  HGBA1C 6.0*   Lipid Profile Recent Labs    02/18/19 1021  CHOL 257*  HDL 51  LDLCALC 179*  TRIG 133    CHOLHDL 5.0   Thyroid function studies No results for input(s): TSH, T4TOTAL, T3FREE, THYROIDAB in the last 72 hours.  Invalid input(s): FREET3 Anemia work up No results for input(s): VITAMINB12, FOLATE, FERRITIN, TIBC, IRON, RETICCTPCT in the last 72 hours. Urinalysis No results found for: COLORURINE, APPEARANCEUR, LABSPEC, [place], GLUCOSEU, [place], [place], KETONESUR, PROTEINUR, UROBILINOGEN, NITRITE, LEUKOCYTESUR Sepsis Labs Invalid input(s): PROCALCITONIN,  WBC,  [place] [facility] Recent Results (from the past 240 hour(s))  SARS Coronavirus 2 [facility] order, Performed in [facility] hospital lab) Nasopharyngeal Nasopharyngeal Swab     Status: None   Collection Time: 02/18/19  2:48 AM   Specimen: Nasopharyngeal Swab  Result Value Ref Range Status   SARS Coronavirus 2 NEGATIVE NEGATIVE Final    Comment: (NOTE) If result is NEGATIVE SARS-CoV-2 target nucleic acids are NOT DETECTED. The SARS-CoV-2 RNA is generally detectable in upper and lower  respiratory specimens during the acute phase of infection. The lowest  concentration of SARS-CoV-2 viral copies this assay can detect is 250  copies / mL. A negative result does not preclude SARS-CoV-2 infection  and should not be used as the sole basis for treatment or other  patient management decisions.  A negative result may occur with  improper specimen collection / handling, submission of specimen other  than nasopharyngeal swab, presence of viral mutation(s) within the  areas targeted by this assay, and inadequate number of viral copies  (<250 copies / mL). A negative result must be combined with clinical  observations, patient history, and epidemiological information. If result is POSITIVE SARS-CoV-2 target nucleic acids are DETECTED. The SARS-CoV-2 RNA is generally detectable in upper and lower  respiratory specimens dur ing the acute phase of infection.  Positive  results are indicative of active infection with  SARS-CoV-2.  Clinical  correlation with patient history and other diagnostic information is  necessary to determine patient infection status.  Positive results do  not rule out bacterial infection or co-infection with other viruses. If result is PRESUMPTIVE POSTIVE SARS-CoV-2 nucleic acids MAY BE PRESENT.   A presumptive positive result was obtained on the submitted specimen  and confirmed on repeat testing.  While 2019 novel coronavirus  (SARS-CoV-2)  nucleic acids may be present in the submitted sample  additional confirmatory testing may be necessary for epidemiological  and / or clinical management purposes  to differentiate between  SARS-CoV-2 and other Sarbecovirus currently known to infect humans.  If clinically indicated additional testing with an alternate test  methodology [phone]([id number]) is advised. The SARS-CoV-2 RNA is generally  detectable in upper and lower respiratory sp ecimens during the acute  phase of infection. The expected result is Negative. Fact Sheet for Patients:  BoilerBrush.com.cyhttps://www.fda.gov/media/136312/download Fact Sheet for Healthcare Providers: https://pope.com/https://www.fda.gov/media/136313/download This test is not yet approved or cleared by the [place] FDA and has been authorized for detection and/or diagnosis of SARS-CoV-2 by FDA under an Emergency Use Authorization (EUA).  This EUA will remain in effect (meaning this test can be used) for the duration of the COVID-19 declaration under Section 564(b)(1) of the Act, 21 U.S.C. section 360bbb-3(b)(1), unless the authorization is terminated or revoked sooner. Performed at [facility], [address] [place], [place] [id number]      SIGNED:   Jacquelin Hawking[place], MD [facility] 02/18/2019, 2:18 PM

## 2019-02-18 NOTE — ED Notes (Signed)
Iv stopped momentarily

## 2019-02-18 NOTE — Consult Note (Addendum)
Cardiology Consultation:   Patient ID: Patricia Ali MRN: [id number]; DOB: July 15, 1970  Admit date: 02/17/2019 Date of Consult: 02/18/2019  Primary Care Provider: Lewis Moccasinewey, Elizabeth R, MD Primary Cardiologist: New to [facility] (previously followed in [place]) Primary Electrophysiologist:  None    Patient Profile:   Patricia Ali is a 48 y.o. female with a hx of NSTEMI and migraine headache who is being seen today for the evaluation of right sided chest pain at the request of Dr. Clyde LundborgNiu.  History of Present Illness:   Patricia Ali is a 48 year old female with past medical history of NSTEMI and migraine headache.  According to the patient, she was previously ruled in for NSTEMI and underwent cardiac catheterization in 2016 at [facility] in [place].  No stent was placed and she was discharged on medical therapy.  She says the cardiologist at the time was not sure if she had a plaque rupture that self resolved on IV heparin, but there was no culprit lesion seen on cath.  Since then, she has had at least 2 stress test.  The last treadmill stress test was obtained in 2018 by cardiologist at [facility].  The result was reportedly normal.  Of note, her previous anginal symptom was left-sided chest pain.  [place] to work in [place] [employer] [place] and also works part-time at [employer].  She was in her usual state of health until 3 AM on 02/17/2019 when she woke up with a right-sided shoulder pain.  The shoulder pain worsens with deep inspiration and rotation of the shoulder.  However she also mentions, although the location of the pain is different, however the characteristic of the pain is very similar to the previous angina.  The pain wax and wane however never completely went away for the entire day.  She finally sought medical attention around 7 PM yesterday after returning home from work.  EKG showed no acute changes.  Serial  troponin were negative.  D-dimer is negative.  Chest x-ray showing no acute changes.  Cardiology has been consulted for this atypical chest pain.    Heart Pathway Score:     Past Medical History:  Diagnosis Date  . Asthma   . Cold sore   . Depression   . Ectopic pregnancy   . MI (myocardial infarction) (HCC)   . Migraine     Past Surgical History:  Procedure Laterality Date  . CARDIAC CATHETERIZATION    . CORONARY ANGIOPLASTY WITH STENT PLACEMENT       Home Medications:  Prior to Admission medications   Medication Sig Start Date End Date Taking? Authorizing Provider  albuterol (VENTOLIN HFA) 108 (90 Base) MCG/ACT inhaler Inhale 1-2 puffs into the lungs every 6 (six) hours as needed for wheezing or shortness of breath.   Yes [provider]  budesonide-formoterol (SYMBICORT) 160-4.5 MCG/ACT inhaler Inhale 2 puffs into the lungs daily.   Yes [provider]  escitalopram (LEXAPRO) 20 MG tablet Take 20 mg by mouth daily.   Yes [provider]  topiramate (TOPAMAX) 50 MG tablet Take 50 mg by mouth 2 (two) times daily.    Yes [provider]  valACYclovir (VALTREX) 1000 MG tablet Take 500 mg by mouth daily.    Yes [provider]    Inpatient Medications: Scheduled Meds: . aspirin  324 mg Oral Daily  . enoxaparin (LOVENOX) injection  40 mg Subcutaneous Q24H  . escitalopram  20 mg Oral Daily  .  mometasone-formoterol  2 puff Inhalation BID  . regadenoson  0.4 mg Intravenous Once  . sodium chloride flush  3 mL Intravenous Once  . topiramate  50 mg Oral BID  . valACYclovir  500 mg Oral Daily   Continuous Infusions: . sodium chloride 100 mL/hr at 02/18/19 0454   PRN Meds: acetaminophen, albuterol, morphine injection, nitroGLYCERIN, ondansetron (ZOFRAN) IV  Allergies:    Allergies  Allergen Reactions  . Amoxicillin Diarrhea    Did it involve swelling of the face/tongue/throat, SOB, or low BP? No Did it involve sudden or severe  rash/hives, skin peeling, or any reaction on the inside of your mouth or nose? No Did you need to seek medical attention at a hospital or doctor's office? No When did it last happen? approx 3 yrs ago If all above answers are "NO", may proceed with cephalosporin use.   . Augmentin [Amoxicillin-Pot Clavulanate] Diarrhea    Did it involve swelling of the face/tongue/throat, SOB, or low BP? No Did it involve sudden or severe rash/hives, skin peeling, or any reaction on the inside of your mouth or nose? No Did you need to seek medical attention at a hospital or doctor's office? No When did it last happen? approx 3 yrs ago If all above answers are "NO", may proceed with cephalosporin use.    Social History:   Social History   Socioeconomic History  . Marital status: Divorced    Spouse name: Not on file  . Number of children: Not on file  . Years of education: Not on file  . Highest education level: Not on file  Occupational History  . Not on file  Social Needs  . Financial resource strain: Not on file  . Food insecurity    Worry: Not on file    Inability: Not on file  . Transportation needs    Medical: Not on file    Non-medical: Not on file  Tobacco Use  . Smoking status: Never Smoker  . Smokeless tobacco: Never Used  Substance and Sexual Activity  . Alcohol use: Yes    Comment: occ  . Drug use: Never  . Sexual activity: Not on file  Lifestyle  . Physical activity    Days per week: Not on file    Minutes per session: Not on file  . Stress: Not on file  Relationships  . Social Herbalist on phone: Not on file    Gets together: Not on file    Attends religious service: Not on file    Active member of club or organization: Not on file    Attends meetings of clubs or organizations: Not on file    Relationship status: Not on file  . Intimate partner violence    Fear of current or ex partner: Not on file    Emotionally abused: Not on file     Physically abused: Not on file    Forced sexual activity: Not on file  Other Topics Concern  . Not on file  Social History Narrative  . Not on file    Family History:    Family History  Problem Relation Age of Onset  . Diabetes Mellitus II Mother   . Heart disease Mother   . Thyroid disease Mother   . Hypotension Sister        Orthostatic hypotension     ROS:  Please see the history of present illness.   All other ROS reviewed and negative.  Physical Exam/Data:   Vitals:   02/18/19 0445 02/18/19 0500 02/18/19 0548 02/18/19 0549  BP: 128/64 (!) 112/56 (!) 127/54   Pulse:  (!) 57 (!) 54 (!) 57  Resp: 17 18 15 19   Temp:      TempSrc:      SpO2:  96% 98% 97%  Weight:      Height:       No intake or output data in the 24 hours ending 02/18/19 0746 Last 3 Weights 02/17/2019 09/15/2018  Weight (lbs) 163 lb 158 lb 12.8 oz  Weight (kg) 73.936 kg 72.031 kg     Body mass index is 26.71 kg/m.  General:  Well nourished, well developed, in no acute distress HEENT: normal Lymph: no adenopathy Neck: no JVD Endocrine:  No thryomegaly Vascular: No carotid bruits; FA pulses 2+ bilaterally without bruits  Cardiac:  normal S1, S2; RRR; no murmur  Lungs:  clear to auscultation bilaterally, no wheezing, rhonchi or rales  Abd: soft, nontender, no hepatomegaly  Ext: no edema Musculoskeletal:  No deformities, BUE and BLE strength normal and equal Skin: warm and dry  Neuro:  CNs 2-12 intact, no focal abnormalities noted Psych:  Normal affect   EKG:  The EKG was personally reviewed and demonstrates: Normal sinus rhythm without significant ST-T wave changes Telemetry:  Telemetry was personally reviewed and demonstrates: Normal sinus rhythm with occasional bradycardia  Relevant CV Studies: N/A  Laboratory Data:  High Sensitivity Troponin:   Recent Labs  Lab 02/17/19 2001 02/17/19 2218 02/18/19 0314 02/18/19 0442  TROPONINIHS 3 3 4 4      Cardiac EnzymesNo results for  input(s): TROPONINI in the last 168 hours. No results for input(s): TROPIPOC in the last 168 hours.  Chemistry Recent Labs  Lab 02/17/19 2001  NA 140  K 3.9  CL 105  CO2 24  GLUCOSE 151*  BUN 24*  CREATININE 1.10*  CALCIUM 9.3  GFRNONAA 58*  GFRAA >60  ANIONGAP 11    No results for input(s): PROT, ALBUMIN, AST, ALT, ALKPHOS, BILITOT in the last 168 hours. Hematology Recent Labs  Lab 02/17/19 2001  WBC 7.8  RBC 4.31  HGB 13.9  HCT 39.2  MCV 91.0  MCH 32.3  MCHC 35.5  RDW 12.3  PLT 208   BNPNo results for input(s): BNP, PROBNP in the last 168 hours.  DDimer  Recent Labs  Lab 02/18/19 0314  DDIMER <0.27     Radiology/Studies:  Dg Chest 2 View  Result Date: 02/17/2019 CLINICAL DATA:  Chest pain EXAM: CHEST - 2 VIEW COMPARISON:  None. FINDINGS: The heart size and mediastinal contours are within normal limits. Both lungs are clear. The visualized skeletal structures are unremarkable. IMPRESSION: No active cardiopulmonary disease. Electronically Signed   By: Alcide CleverMark  Lukens M.D.   On: 02/17/2019 20:21    Assessment and Plan:   1. Atypical chest pain: Although characteristics to chest pain similar to the previous angina, however chest pain is now located on the right shoulder area instead of the left side.  Prolonged chest pain that lasted throughout the entire day with negative serial troponin.    - Chest pain exacerbated by palpation, deep inspiration and shoulder rotation.    - Stress test has already been ordered given her prior history of NSTEMI.  - She has not established with a local cardiologist since moving from [place], if stress test is normal, she will followup with Dr. Eden EmmsNishan as outpatient.    - Additional records will need to  be requested from [facility] in [place].  This can be obtained prior to her next office visit.  2. Relative bradycardia: No beta-blocker.      For questions or updates, please contact [facility]  Please consult [email or web address] for contact info under     Signed, Azalee CourseHao Meng, [place]  02/18/2019 7:46 AM   Patient examined chart reviewed Discussed care with patient and PA. Exam benign. Multiple tatoos on skin clear lungs no murmur soft abdomen good peripheral pulses. SSCP R/O no acute ECG changes normal CXR. Previous cath no intervention likely normal in [place] will arrange lexiscan myovue today Likely d/c latter if normal   Charlton HawsPeter Telvin Reinders

## 2019-02-18 NOTE — ED Notes (Signed)
Attempted to call report to 6E at [phone], nurse unavailable.

## 2019-02-18 NOTE — ED Provider Notes (Signed)
[facility] EMERGENCY DEPARTMENT Provider Note   CSN: [id number] Arrival date & time: 02/17/19  1942    History   Chief Complaint Chief Complaint  Patient presents with  . Chest Pain    HPI Patricia Ali is a 48 y.o. female.   The history is provided by the patient.  Chest Pain She has history of asthma and myocardial infarction with stent placement comes in because of pain in her right shoulder which radiated into her chest, neck, arm.  Pain woke her up at 3 AM.  She thought she had just been sleeping on her arm wrong, but pain has persisted through the day.  At its worst, it was rated at 9/10.  There was radiation to the chest, neck, arm.  She did notice some numbness in the fingers of her hand.  Pain has waxed and waned through the day but has not gone away completely.  There is associated dyspnea, nausea, diaphoresis.  There was no vomiting.  Nothing made the pain better, nothing made it worse.  She has not taken anything for it.  She does not have a cardiologist locally having just moved here recently from [place], where her angioplasty had been done.  She is a non-smoker and denies history of hypertension, diabetes, hyperlipidemia.  Past Medical History:  Diagnosis Date  . Asthma   . Depression   . Ectopic pregnancy   . MI (myocardial infarction) (HCC)     There are no active problems to display for this patient.   Past Surgical History:  Procedure Laterality Date  . CARDIAC CATHETERIZATION    . CORONARY ANGIOPLASTY WITH STENT PLACEMENT       OB History   No obstetric history on file.      Home Medications    Prior to Admission medications   Medication Sig Start Date End Date Taking? Authorizing Provider  albuterol (VENTOLIN HFA) 108 (90 Base) MCG/ACT inhaler Inhale 1-2 puffs into the lungs every 6 (six) hours as needed for wheezing or shortness of breath.   Yes [provider]  budesonide-formoterol (SYMBICORT) 160-4.5 MCG/ACT  inhaler Inhale 2 puffs into the lungs daily.   Yes [provider]  escitalopram (LEXAPRO) 20 MG tablet Take 20 mg by mouth daily.   Yes [provider]  topiramate (TOPAMAX) 50 MG tablet Take 50 mg by mouth 2 (two) times daily.    Yes [provider]  valACYclovir (VALTREX) 1000 MG tablet Take 500 mg by mouth daily.    Yes [provider]    Family History No family history on file.  Social History Social History   Tobacco Use  . Smoking status: Never Smoker  . Smokeless tobacco: Never Used  Substance Use Topics  . Alcohol use: Yes    Comment: occ  . Drug use: Never     Allergies   Amoxicillin and Augmentin [amoxicillin-pot clavulanate]   Review of Systems Review of Systems  Cardiovascular: Positive for chest pain.  All other systems reviewed and are negative.    Physical Exam Updated Vital Signs BP 128/72 (BP Location: Left Arm)   Pulse (!) 59   Temp 98.1 F (36.7 C) (Oral)   Resp 16   Ht 5' 5.5" (1.664 m)   Wt 73.9 kg   SpO2 98%   BMI 26.71 kg/m   Physical Exam Vitals signs and nursing note reviewed.    48 year old female, resting comfortably and in no acute distress. Vital signs are normal.  Oxygen saturation is 98%, which is normal. Head is normocephalic and atraumatic. PERRLA, EOMI. Oropharynx is clear. Neck is nontender and supple without adenopathy or JVD. Back is nontender and there is no CVA tenderness. Lungs are clear without rales, wheezes, or rhonchi. Chest is moderately tender in the right upper anterior chest wall. Heart has regular rate and rhythm without murmur. Abdomen is soft, flat, nontender without masses or hepatosplenomegaly and peristalsis is normoactive. Extremities have no cyanosis or edema, full range of motion is present. Skin is warm and dry without rash. Neurologic: Mental status is normal, cranial nerves are intact, there are no motor or sensory deficits.  ED Treatments / Results  Labs  (all labs ordered are listed, but only abnormal results are displayed) Labs Reviewed  BASIC METABOLIC PANEL - Abnormal; Notable for the following components:      Result Value   Glucose, Bld 151 (*)    BUN 24 (*)    Creatinine, Ser 1.10 (*)    GFR calc non Af Amer 58 (*)    All other components within normal limits  CBC  I-STAT BETA HCG BLOOD, ED ([facility], [facility], [facility] ONLY)  TROPONIN I (HIGH SENSITIVITY)  TROPONIN I (HIGH SENSITIVITY)    EKG EKG Interpretation  Date/Time:  Saturday February 17 2019 19:50:45 EDT Ventricular Rate:  85 PR Interval:  146 QRS Duration: 80 QT Interval:  392 QTC Calculation: 466 R Axis:   63 Text Interpretation:  Normal sinus rhythm Cannot rule out Anterior infarct , age undetermined Abnormal ECG No old tracing to compare Confirmed by Delora Fuel ([id number]) on 02/17/2019 11:35:44 PM   Radiology Dg Chest 2 View  Result Date: 02/17/2019 CLINICAL DATA:  Chest pain EXAM: CHEST - 2 VIEW COMPARISON:  None. FINDINGS: The heart size and mediastinal contours are within normal limits. Both lungs are clear. The visualized skeletal structures are unremarkable. IMPRESSION: No active cardiopulmonary disease. Electronically Signed   By: Inez Catalina M.D.   On: 02/17/2019 20:21    Procedures Procedures   Medications Ordered in ED Medications  sodium chloride flush (NS) 0.9 % injection 3 mL (has no administration in time range)     Initial Impression / Assessment and Plan / ED Course  I have reviewed the triage vital signs and the nursing notes.  Pertinent labs & imaging results that were available during my care of the patient were reviewed by me and considered in my medical decision making (see chart for details).  Pain in right shoulder, chest, neck, arm.  ECG shows no acute changes.  Chest x-ray is normal.  Troponin is normal x2.  ACS very unlikely with pain at this duration and 2 negative troponins.  Suspect musculoskeletal pain with chest wall tenderness.  She is given  a dose of aspirin and will be given a trial of ketorolac.  She had no relief of pain with ketorolac.  I am concerned since she does state this is similar to her presentation with her heart attack.  Also, she does not have a cardiologist that she has established with locally.  Case is discussed with Dr. Blaine Hamper of [facility] hospital[facility] who agrees to admit the patient.  Also, it is noted that she has a borderline elevated glucose.  She does have history of gestational diabetes.  Elevated glucose level today probably represents prediabetes.  Final Clinical Impressions(s) / ED Diagnoses   Final diagnoses:  Chest pain, unspecified type  Elevated glucose level    ED Discharge Orders  None       Dione BoozeGlick, Yoana Staib, MD 02/18/19 [phone]

## 2019-02-18 NOTE — Progress Notes (Signed)
   Stress test low risk and showed no reversible ischemia or infraction. EF estimated around 50%. Discussed results with Dr. Johnsie Cancel who recommended outpatient Echo for further evaluation of EF and follow-up. Will order outpatient Echo and sent message to scheduler to arrange follow-up appointment. OK for discharge from a cardiac standpoint. Called and discussed results with patient. Will notify primary team.  Darreld Mclean, PA-C 02/18/2019 12:33 PM

## 2019-02-18 NOTE — H&P (Signed)
History and Physical    Patricia SousHolly Gribble AVW:[id number] DOB: [birth year] DOA: 02/17/2019  Referring MD/NP/PA:   PCP: Lewis Moccasinewey, Elizabeth R, MD   Patient coming from:  The patient is coming from home.  At baseline, pt is independent for most of ADL.        Chief Complaint: Chest pain  HPI: Patricia Ali is a 48 y.o. female with medical history significant of CAD and MI 4 years ago (no stent placement), migraine headache, asthma, depression, who presents with chest pain.  Patient states that she was waken up by chest pain at about 3 AM.  The chest pain is located in the right upper chest, initially 9 out of 10 in severity, currently 5 out of 10 in severity, sharp, pleuritic, aggravated by deep breath, radiating to the left arm, associated with diaphoresis.  Patient does not have cough, shortness breath, fever or chills.  No recent long distant traveling.  No tenderness in the calf areas.  Patient states that initially her chest pain was radiating to right arm, causing right arm tingling, which has resolved.  Currently patient does not have any tingling, numbness or weakness in the right arm.  No facial droop or slurred speech.  No vision change or hearing loss.  Patient has  nausea, but no vomiting, diarrhea, abdominal pain.  No symptoms of UTI.  She states that she is taking Valtrex for nose cold sore chronically.  ED Course: pt was found to have negative troponin x2, WBC 7.8, negative pregnancy test, pending COVID-19 test, creatinine 1.10, BUN 24, temperature normal, blood pressure 146/58, heart rate 52, oxygen saturation 98% on room air, chest x-ray negative.  Patient is placed on telemetry bed for observation.  Review of Systems:   General: no fevers, chills, no body weight gain, has fatigue HEENT: no blurry vision, hearing changes or sore throat Respiratory: no dyspnea, coughing, wheezing CV: has chest pain, no palpitations GI: has nausea, no vomiting, abdominal pain, diarrhea, constipation GU: no  dysuria, burning on urination, increased urinary frequency, hematuria  Ext: no leg edema Neuro: has right arm tingling, no vision change or hearing loss Skin: no rash, no skin tear. MSK: No muscle spasm, no deformity, no limitation of range of movement in spin Heme: No easy bruising.  Travel history: No recent long distant travel.  Allergy:  Allergies  Allergen Reactions  . Amoxicillin Diarrhea    Did it involve swelling of the face/tongue/throat, SOB, or low BP? No Did it involve sudden or severe rash/hives, skin peeling, or any reaction on the inside of your mouth or nose? No Did you need to seek medical attention at a hospital or doctor's office? No When did it last happen? approx 3 yrs ago If all above answers are "NO", may proceed with cephalosporin use.   . Augmentin [Amoxicillin-Pot Clavulanate] Diarrhea    Did it involve swelling of the face/tongue/throat, SOB, or low BP? No Did it involve sudden or severe rash/hives, skin peeling, or any reaction on the inside of your mouth or nose? No Did you need to seek medical attention at a hospital or doctor's office? No When did it last happen? approx 3 yrs ago If all above answers are "NO", may proceed with cephalosporin use.    Past Medical History:  Diagnosis Date  . Asthma   . Cold sore   . Depression   . Ectopic pregnancy   . MI (myocardial infarction) (HCC)   . Migraine     Past Surgical History:  Procedure Laterality Date  . CARDIAC CATHETERIZATION    . CORONARY ANGIOPLASTY WITH STENT PLACEMENT      Social History:  reports that she has never smoked. She has never used smokeless tobacco. She reports current alcohol use. She reports that she does not use drugs.  Family History:  Family History  Problem Relation Age of Onset  . Diabetes Mellitus II Mother   . Heart disease Mother   . Thyroid disease Mother   . Hypotension Sister        Orthostatic hypotension     Prior to Admission medications    Medication Sig Start Date End Date Taking? Authorizing Provider  albuterol (VENTOLIN HFA) 108 (90 Base) MCG/ACT inhaler Inhale 1-2 puffs into the lungs every 6 (six) hours as needed for wheezing or shortness of breath.   Yes [provider]  budesonide-formoterol (SYMBICORT) 160-4.5 MCG/ACT inhaler Inhale 2 puffs into the lungs daily.   Yes [provider]  escitalopram (LEXAPRO) 20 MG tablet Take 20 mg by mouth daily.   Yes [provider]  topiramate (TOPAMAX) 50 MG tablet Take 50 mg by mouth 2 (two) times daily.    Yes [provider]  valACYclovir (VALTREX) 1000 MG tablet Take 500 mg by mouth daily.    Yes [provider]    Physical Exam: Vitals:   02/18/19 0145 02/18/19 0200 02/18/19 0230 02/18/19 0300  BP: (!) 146/58 (!) 125/48  (!) 131/58  Pulse: (!) 52 (!) 57 (!) 58 (!) 58  Resp: 20 13 18 14   Temp:      TempSrc:      SpO2: 98% 100% 95% 97%  Weight:      Height:       General: Not in acute distress HEENT:       Eyes: PERRL, EOMI, no scleral icterus.       ENT: No discharge from the ears and nose, no pharynx injection, no tonsillar enlargement.        Neck: No JVD, no bruit, no mass felt. Heme: No neck lymph node enlargement. Cardiac: S1/S2, RRR, No murmurs, No gallops or rubs. Respiratory: No rales, wheezing, rhonchi or rubs. GI: Soft, nondistended, nontender, no rebound pain, no organomegaly, BS present. GU: No hematuria Ext: No pitting leg edema bilaterally. 2+DP/PT pulse bilaterally. Musculoskeletal: No joint deformities, No joint redness or warmth, no limitation of ROM in spin. Skin: No rashes.  Neuro: Alert, oriented X3, cranial nerves II-XII grossly intact, moves all extremities normally. Muscle strength 5/5 in all extremities, sensation to light touch intact.  Psych: Patient is not psychotic, no suicidal or hemocidal ideation.  Labs on Admission: I have personally reviewed following labs and imaging studies  CBC:  Recent Labs  Lab 02/17/19 2001  WBC 7.8  HGB 13.9  HCT 39.2  MCV 91.0  PLT 208   Basic Metabolic Panel: Recent Labs  Lab 02/17/19 2001  NA 140  K 3.9  CL 105  CO2 24  GLUCOSE 151*  BUN 24*  CREATININE 1.10*  CALCIUM 9.3   GFR: Estimated Creatinine Clearance: 62.3 mL/min (A) (by C-G formula based on SCr of 1.1 mg/dL (H)). Liver Function Tests: No results for input(s): AST, ALT, ALKPHOS, BILITOT, PROT, ALBUMIN in the last 168 hours. No results for input(s): LIPASE, AMYLASE in the last 168 hours. No results for input(s): AMMONIA in the last 168 hours. Coagulation Profile: No results for input(s): INR, PROTIME in the last 168 hours. Cardiac Enzymes: No results for input(s): CKTOTAL, CKMB,  CKMBINDEX, TROPONINI in the last 168 hours. BNP (last 3 results) No results for input(s): PROBNP in the last 8760 hours. HbA1C: No results for input(s): HGBA1C in the last 72 hours. CBG: No results for input(s): GLUCAP in the last 168 hours. Lipid Profile: No results for input(s): CHOL, HDL, LDLCALC, TRIG, CHOLHDL, LDLDIRECT in the last 72 hours. Thyroid Function Tests: No results for input(s): TSH, T4TOTAL, FREET4, T3FREE, THYROIDAB in the last 72 hours. Anemia Panel: No results for input(s): VITAMINB12, FOLATE, FERRITIN, TIBC, IRON, RETICCTPCT in the last 72 hours. Urine analysis: No results found for: COLORURINE, APPEARANCEUR, LABSPEC, PHURINE, GLUCOSEU, HGBUR, BILIRUBINUR, KETONESUR, PROTEINUR, UROBILINOGEN, NITRITE, LEUKOCYTESUR Sepsis Labs: @LABRCNTIP (procalcitonin:4,lacticidven:4) )No results found for this or any previous visit (from the past 240 hour(s)).   Radiological Exams on Admission: Dg Chest 2 View  Result Date: 02/17/2019 CLINICAL DATA:  Chest pain EXAM: CHEST - 2 VIEW COMPARISON:  None. FINDINGS: The heart size and mediastinal contours are within normal limits. Both lungs are clear. The visualized skeletal structures are unremarkable. IMPRESSION: No active  cardiopulmonary disease. Electronically Signed   By: Inez Catalina M.D.   On: 02/17/2019 20:21     EKG: Independently reviewed.  Sinus rhythm, QTC 466, low voltage, LAE,  Assessment/Plan Principal Problem:   Chest pain Active Problems:   Asthma   CAD (coronary artery disease)   Depression   Numbness and tingling of right arm   Migraine   Chest pain and hx of CAD: pt had MI 4 years ago, no stent placement per pt. Now has pleuritic chest pain.  Will need to rule out PE.  - will place on Tele bed for obs - Trend Trop - Repeat EKG in the am  - prn Nitroglycerin, Morphine, and aspirin - Risk factor stratification: will check FLP and A1C  - check UDS - Stat D-dimer, if positive, will get CTA to r/o PE - inpt card consult was requested via Epic  Numbness and tingling of right arm: The symptoms has resolved.  Most likely due to referred symptoms from her chest pain.  Currently no focal neurologic findings on physical exam.  Low suspicions for stroke. -Observe closely, if symptoms appear again, may need to get MRI of brain and neck.  Asthma: stable. -Dulera inhaler and prn albuterol inhaler  Depression: -Lexapro  Migraine: -Topamax    DVT ppx: SQ Lovenox Code Status: Full code Family Communication: None at bed side.      Disposition Plan:  Anticipate discharge back to previous home environment Consults called:  none Admission status: Obs / tele    Date of Service 02/18/2019    [facility]   If 7PM-7AM, please contact night-coverage [email or web address] Password [email or web address] 02/18/2019, 3:05 AM

## 2019-02-18 NOTE — Progress Notes (Signed)
Test ended/ pt did well. Provided with snack.

## 2019-02-20 ENCOUNTER — Other Ambulatory Visit: Payer: Self-pay

## 2019-02-20 ENCOUNTER — Ambulatory Visit (HOSPITAL_COMMUNITY): Payer: BC Managed Care – PPO | Attending: Cardiology

## 2019-02-20 ENCOUNTER — Telehealth: Payer: Self-pay | Admitting: Cardiovascular Disease

## 2019-02-20 DIAGNOSIS — R079 Chest pain, unspecified: Secondary | ICD-10-CM | POA: Diagnosis present

## 2019-02-20 NOTE — Telephone Encounter (Signed)
Spoke with patient regarding post hospital visit ordered by Sande Rives, PA-C.  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

## 2019-02-23 ENCOUNTER — Ambulatory Visit (INDEPENDENT_AMBULATORY_CARE_PROVIDER_SITE_OTHER): Payer: BC Managed Care – PPO | Admitting: Cardiology

## 2019-02-23 ENCOUNTER — Other Ambulatory Visit: Payer: Self-pay

## 2019-02-23 VITALS — BP 116/70 | HR 58 | Ht 65.5 in | Wt 160.0 lb

## 2019-02-23 DIAGNOSIS — R079 Chest pain, unspecified: Secondary | ICD-10-CM | POA: Diagnosis not present

## 2019-02-23 DIAGNOSIS — E782 Mixed hyperlipidemia: Secondary | ICD-10-CM | POA: Diagnosis not present

## 2019-02-23 DIAGNOSIS — I252 Old myocardial infarction: Secondary | ICD-10-CM | POA: Diagnosis not present

## 2019-02-23 MED ORDER — ROSUVASTATIN CALCIUM 10 MG PO TABS: 10.0000 mg | ORAL_TABLET | Freq: Every day | ORAL | 3 refills | Status: AC

## 2019-02-23 NOTE — Patient Instructions (Addendum)
Medication Instructions:  Your physician has recommended you make the following change in your medication:  1.  START Crestor 10 mg daily  If you need a refill on your cardiac medications before your next appointment, please call your pharmacy.   Lab work: 04/26/2019:  COME FASTING FOR LIPID & LFT  If you have labs (blood work) drawn today and your tests are completely normal, you will receive your results only by: Marland Kitchen MyChart Message (if you have MyChart) OR . A paper copy in the mail If you have any lab test that is abnormal or we need to change your treatment, we will call you to review the results.  Testing/Procedures: None ordered  Follow-Up: At [facility], you and your health needs are our priority.  As part of our continuing mission to provide you with exceptional heart care, we have created designated Provider Care Teams.  These Care Teams include your primary Cardiologist (physician) and Advanced Practice Providers (APPs -  Physician Assistants and Nurse Practitioners) who all work together to provide you with the care you need, when you need it. You will need a follow up appointment in 12 months.  Please call our office 2 months in advance to schedule this appointment.  You may see No primary care provider on file. or one of the following Advanced Practice Providers on your designated Care Team:   Truitt Merle, NP Cecilie Kicks, NP . Kathyrn Drown, NP  Any Other Special Instructions Will Be Listed Below (If Applicable).

## 2019-02-23 NOTE — Progress Notes (Signed)
Virtual Visit via Video Note   This visit type was conducted due to national recommendations for restrictions regarding the COVID-19 Pandemic (e.g. social distancing) in an effort to limit this patient's exposure and mitigate transmission in our community.  Due to her co-morbid illnesses, this patient is at least at moderate risk for complications without adequate follow up.  This format is felt to be most appropriate for this patient at this time.  All issues noted in this document were discussed and addressed.  A limited physical exam was performed with this format.  Please refer to the patient's chart for her consent to telehealth for [facility].   Date:  02/23/2019   ID:  Patricia SousHolly Fuerte, DOB Apr 07, 1971, MRN [id number]  Patient Location: Other:  office Provider Location: Home  PCP:  Lewis Moccasinewey, Elizabeth R, MD  Cardiologist:  Charlton HawsPeter Nishan, MD  Electrophysiologist:  None   Evaluation Performed:  Follow-Up Visit  Chief Complaint:  Chest pain  History of Present Illness:    Patricia Ali is a 48 y.o. female with post hospital visit for chest pain.  She has a past medical history of NSTEMI and migraine headache.  According to the patient, she was previously ruled in for NSTEMI and underwent cardiac catheterization in 2016 at [facility] in [place].  No stent was placed and she was discharged on medical therapy    She says the cardiologist at the time was not sure if she had a plaque rupture that self resolved on IV heparin, but there was no culprit lesion seen on cath.  It is noted on cath that flow initially was sluggish.   Since then, she has had at least 2 stress test.  The last treadmill stress test was obtained in 2018 by cardiologist at [facility].  The result was reportedly normal.  Of note, her previous anginal symptom was left-sided chest pain.  Neg troponin, LDL 179  Neg MI and neg nuc study and echo done with normal EF.    Today pt is doing well with  some nausea at times.  She has had brief episodes of rapid HR, not lasting long.  Her LDL is elevated at 179 - she had been on statin in past but made nauseated.  She doesn't remember name. But with hx of MI, and stability that statins may provide with cholesterol will add crestor.  Pt agreeable.   The patient does not have symptoms concerning for COVID-19 infection (fever, chills, cough, or new shortness of breath).    Past Medical History:  Diagnosis Date  . Asthma   . CAD (coronary artery disease)   . Chest pain   . Cold sore   . Depression   . Dyspnea   . Ectopic pregnancy   . Edema of lower extremity    bilateral  . H/O heart artery stent   . History of non-ST elevation myocardial infarction (NSTEMI)   . Lightheadedness   . MI (myocardial infarction) (HCC)   . Migraine   . Orthopnea   . SOB (shortness of breath)   . ST elevation MI (STEMI) (HCC) 2017  . Troponin level elevated    Past Surgical History:  Procedure Laterality Date  . CARDIAC CATHETERIZATION    . CORONARY ANGIOPLASTY WITH STENT PLACEMENT       Current Meds  Medication Sig  . albuterol (VENTOLIN HFA) 108 (90 Base) MCG/ACT inhaler Inhale 1-2 puffs into the lungs every 6 (six) hours as needed for wheezing or shortness  of breath.  . budesonide-formoterol (SYMBICORT) 160-4.5 MCG/ACT inhaler Inhale 2 puffs into the lungs daily.  Marland Kitchen escitalopram (LEXAPRO) 20 MG tablet Take 20 mg by mouth daily.  Marland Kitchen loratadine (CLARITIN) 10 MG tablet Take 10 mg by mouth daily as needed for allergies.  . nitroGLYCERIN (NITROSTAT) 0.4 MG SL tablet Place 1 tablet (0.4 mg total) under the tongue every 5 (five) minutes as needed for chest pain (Up to twice before requesting medical help).  Marland Kitchen omeprazole (PRILOSEC) 20 MG capsule Take 20 mg by mouth daily as needed.  . topiramate (TOPAMAX) 50 MG tablet Take 50 mg by mouth 2 (two) times daily.   . valACYclovir (VALTREX) 1000 MG tablet Take 500 mg by mouth daily.      Allergies:    Amoxicillin and Augmentin [amoxicillin-pot clavulanate]   Social History   Tobacco Use  . Smoking status: Never Smoker  . Smokeless tobacco: Never Used  Substance Use Topics  . Alcohol use: Yes    Comment: occ  . Drug use: Never     Family Hx: The patient's family history includes Diabetes Mellitus II in her mother; Heart disease in her mother; Hypotension in her sister; Thyroid disease in her mother.  ROS:   Please see the history of present illness.    General:no colds or fevers, no weight changes Skin:no rashes or ulcers HEENT:no blurred vision, no congestion CV:see HPI PUL:see HPI GI:no diarrhea constipation or melena, no indigestion GU:no hematuria, no dysuria MS:no joint pain, no claudication Neuro:no syncope, no lightheadedness Endo:no diabetes, no thyroid disease  All other systems reviewed and are negative.   Prior CV studies:   The following studies were reviewed today:  02/20/19 Echo IMPRESSIONS    1. The left ventricle has normal systolic function with an ejection fraction of 60-65%. The cavity size was normal. Left ventricular diastolic parameters were normal. No evidence of left ventricular regional wall motion abnormalities.  2. The right ventricle has normal systolic function. The cavity was normal. There is no increase in right ventricular wall thickness. Right ventricular systolic pressure is normal with an estimated pressure of 35.8 mmHg.  3. The aortic valve is tricuspid. Mild sclerosis of the aortic valve.  4. The aorta is normal in size and structure.  FINDINGS  Left Ventricle: The left ventricle has normal systolic function, with an ejection fraction of 60-65%. The cavity size was normal. There is no increase in left ventricular wall thickness. Left ventricular diastolic parameters were normal. Normal left  ventricular filling pressures No evidence of left ventricular regional wall motion abnormalities..  Right Ventricle: The right ventricle  has normal systolic function. The cavity was normal. There is no increase in right ventricular wall thickness. Right ventricular systolic pressure is normal with an estimated pressure of 35.8 mmHg.  Left Atrium: Left atrial size was normal in size.  Right Atrium: Right atrial size was normal in size. Right atrial pressure is estimated at 10 mmHg.  Interatrial Septum: No atrial level shunt detected by color flow Doppler.  Pericardium: There is no evidence of pericardial effusion.  Mitral Valve: The mitral valve is normal in structure. Mitral valve regurgitation is mild by color flow Doppler.  Tricuspid Valve: The tricuspid valve is normal in structure. Tricuspid valve regurgitation is mild by color flow Doppler.  Aortic Valve: The aortic valve is tricuspid Mild sclerosis of the aortic valve. Aortic valve regurgitation was not visualized by color flow Doppler.  Pulmonic Valve: The pulmonic valve was normal in structure. Pulmonic valve  regurgitation is not visualized by color flow Doppler.  Aorta: The aorta is normal in size and structure.  Venous: The inferior vena cava measures 1.40 cm, is normal in size with greater than 50% respiratory variability.    nuc study 02/18/19  T wave inversion of 1 mm was noted during stress in the III leads.  The study is normal.  This is a low risk study.  The left ventricular ejection fraction is mildly decreased (45-54%).   Normal resting and stress perfusion. No ischemia or infarction EF Estimated 50% but looks normal Can consider outpatient echo to assess EF with another modality   Labs/Other Tests and Data Reviewed:    EKG:  An ECG dated 02/19/19 was personally reviewed today and demonstrated:  SR and no acute ST changes  Recent Labs: 02/17/2019: BUN 24; Creatinine, Ser 1.10; Hemoglobin 13.9; Platelets 208; Potassium 3.9; Sodium 140   Recent Lipid Panel Lab Results  Component Value Date/Time   CHOL 257 (H) 02/18/2019 10:21 AM    TRIG 133 02/18/2019 10:21 AM   HDL 51 02/18/2019 10:21 AM   CHOLHDL 5.0 02/18/2019 10:21 AM   LDLCALC 179 (H) 02/18/2019 10:21 AM    Wt Readings from Last 3 Encounters:  02/23/19 160 lb (72.6 kg)  02/17/19 163 lb (73.9 kg)  09/15/18 158 lb 12.8 oz (72 kg)     Objective:    Vital Signs:  BP 116/70   Pulse (!) 58   Ht 5' 5.5" (1.664 m)   Wt 160 lb (72.6 kg)   SpO2 98%   BMI 26.22 kg/m    VITAL SIGNS:  reviewed  General NAD Lungs: can speak in complete sentences without SOB. Neuro A&O X 3 MAE follows commands  ASSESSMENT & PLAN:    1. Chest pain non cardiac with neg nuc stress test for ischemia and normal EF on Echo.   2. Hx of NSTEMI with LAD with sluggish flow on cath at that time, normal EF 3. HLD will add crestor 10 mg recheck hepatic and lipid in 2 months, follow up with Dr. Eden EmmsNishan in 1 year.     COVID-19 Education: The signs and symptoms of COVID-19 were discussed with the patient and how to seek care for testing (follow up with PCP or arrange E-visit).  The importance of social distancing was discussed today.  Time:   Today, I have spent 10 minutes with the patient with telehealth technology discussing the above problems.     Medication Adjustments/Labs and Tests Ordered: Current medicines are reviewed at length with the patient today.  Concerns regarding medicines are outlined above.   Tests Ordered: No orders of the defined types were placed in this encounter.   Medication Changes: No orders of the defined types were placed in this encounter.   Follow Up:  In Person in 1 year(s)  Signed, Nada BoozerLaura Nickolus Wadding, NP  02/23/2019 4:18 PM    [facility]

## 2019-04-26 ENCOUNTER — Other Ambulatory Visit: Payer: BC Managed Care – PPO

## 2019-05-04 ENCOUNTER — Other Ambulatory Visit: Payer: Self-pay

## 2019-05-04 DIAGNOSIS — Z20822 Contact with and (suspected) exposure to covid-19: Secondary | ICD-10-CM

## 2019-05-05 LAB — NOVEL CORONAVIRUS, NAA: SARS-CoV-2, NAA: NOT DETECTED

## 2019-09-17 ENCOUNTER — Ambulatory Visit: Payer: BC Managed Care – PPO | Attending: Internal Medicine

## 2019-09-17 DIAGNOSIS — Z20822 Contact with and (suspected) exposure to covid-19: Secondary | ICD-10-CM

## 2019-09-18 LAB — NOVEL CORONAVIRUS, NAA: SARS-CoV-2, NAA: NOT DETECTED

## 2020-03-31 ENCOUNTER — Other Ambulatory Visit: Payer: Self-pay | Admitting: Family Medicine

## 2020-03-31 DIAGNOSIS — R5381 Other malaise: Secondary | ICD-10-CM

## 2020-04-03 ENCOUNTER — Other Ambulatory Visit: Payer: Self-pay | Admitting: Family Medicine

## 2020-04-03 DIAGNOSIS — Z1231 Encounter for screening mammogram for malignant neoplasm of breast: Secondary | ICD-10-CM

## 2020-04-29 ENCOUNTER — Ambulatory Visit: Payer: BC Managed Care – PPO

## 2020-05-14 IMAGING — DX CHEST - 2 VIEW
2 series · 2 of 2 positions shown · non-contrast
Comparison: None.

CLINICAL DATA: Chest pain

EXAM:
CHEST - 2 VIEW

[chest lat]
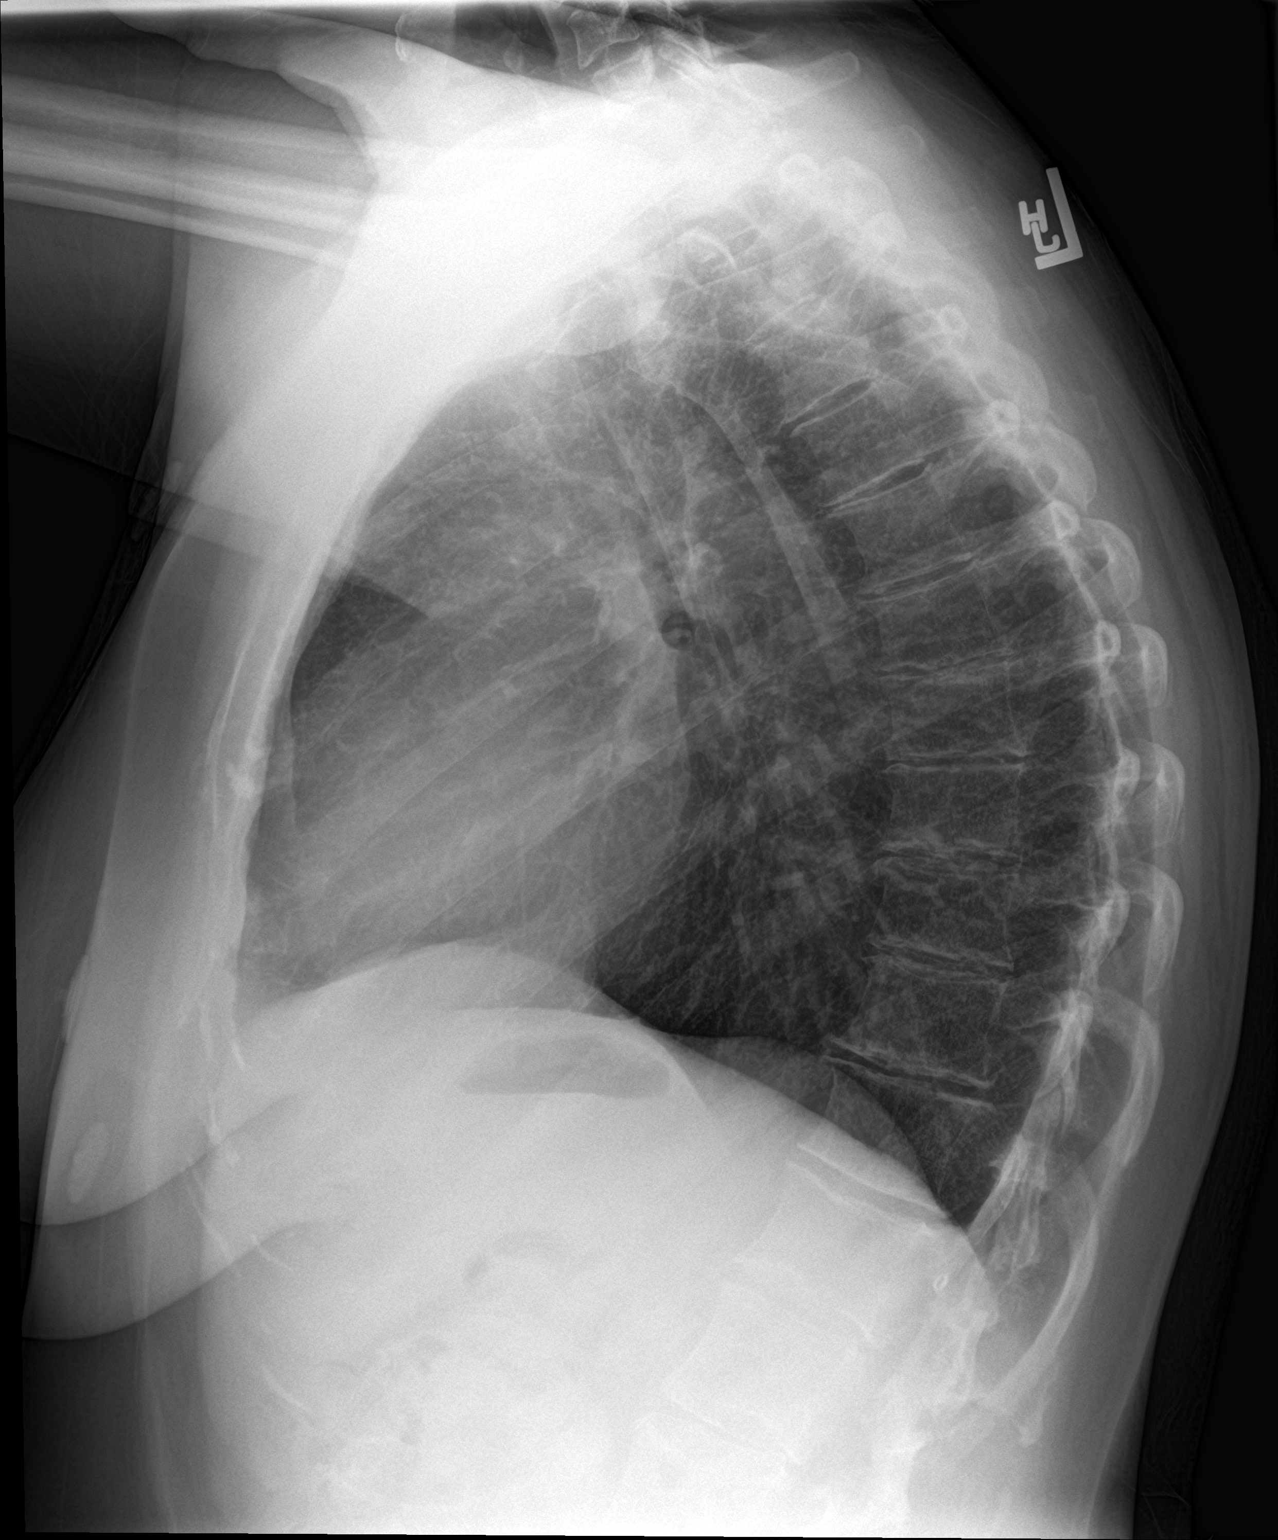

[chest pa]
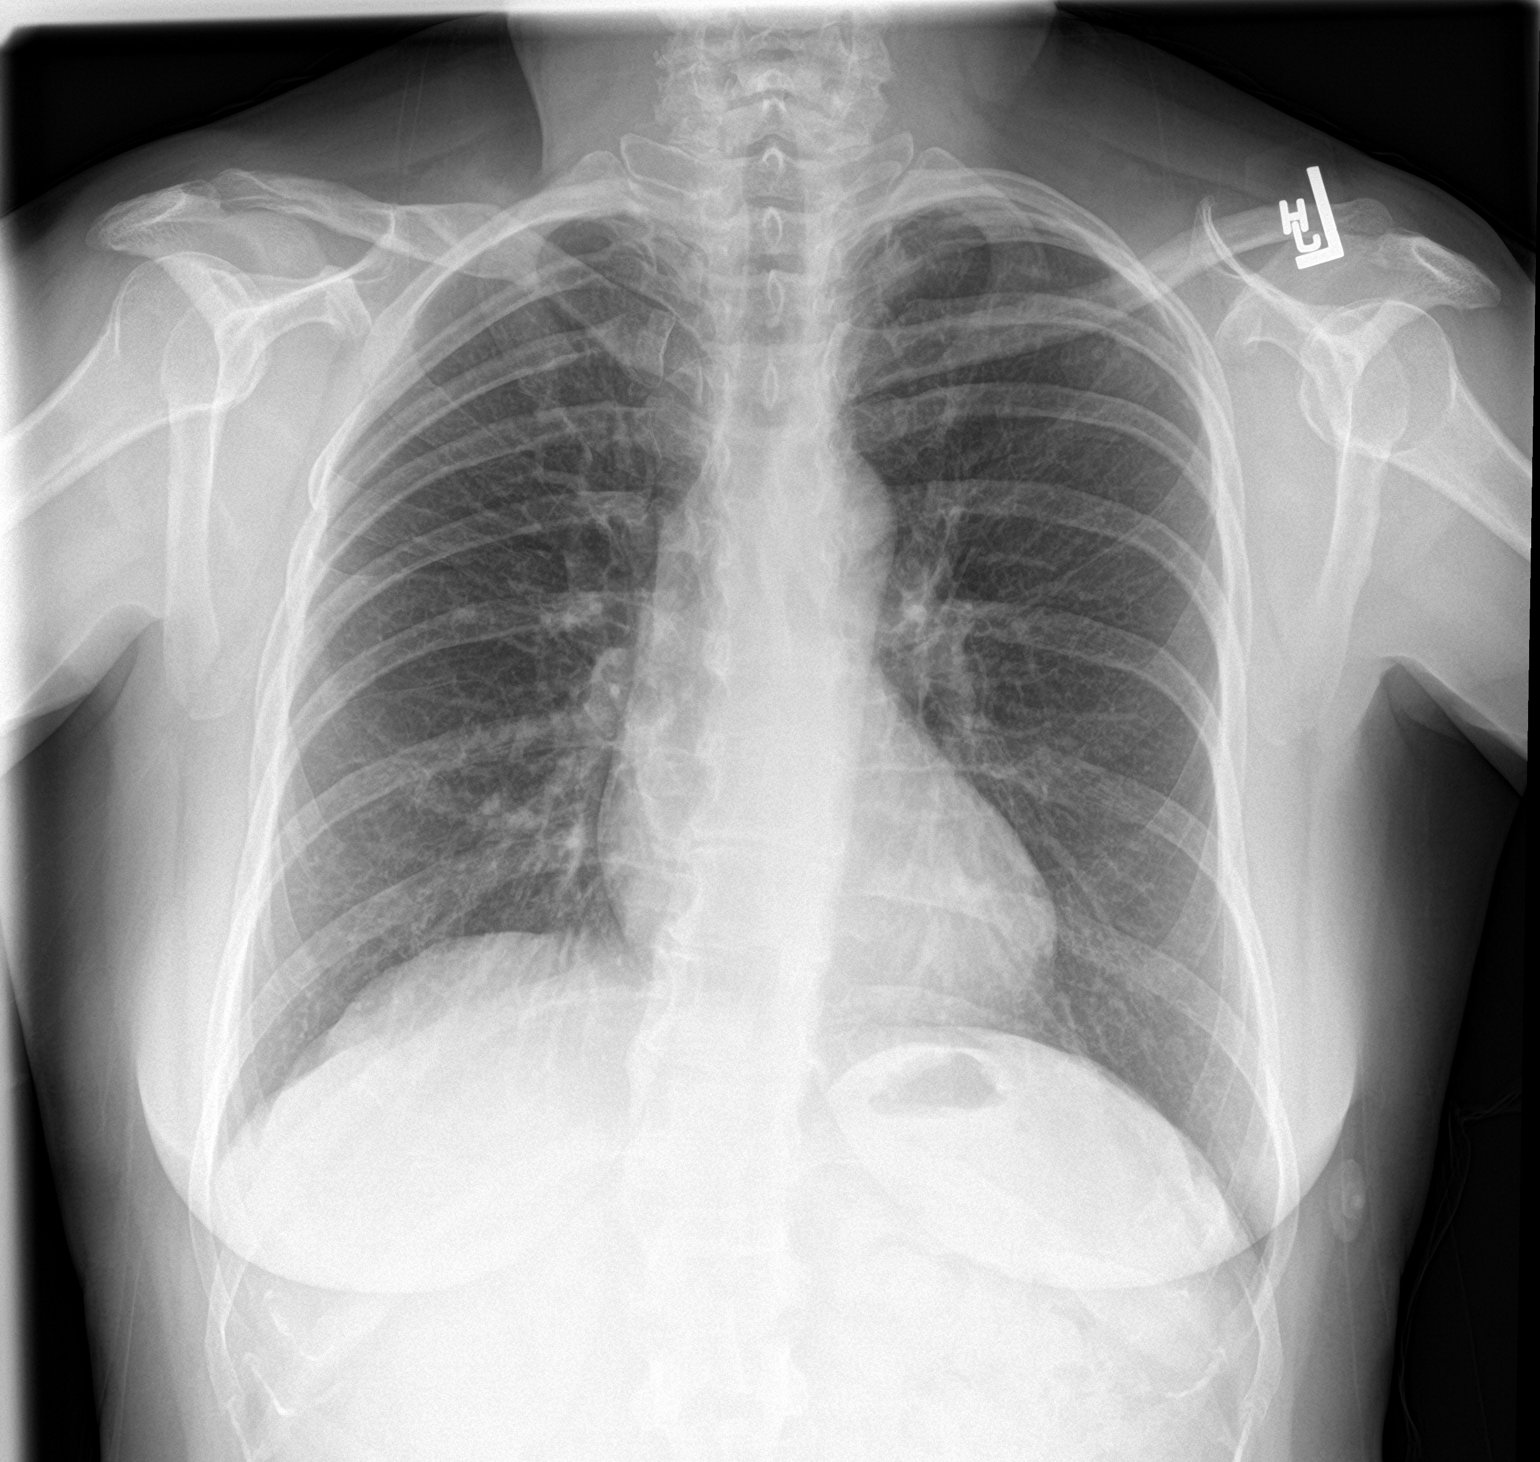

[2 of 2 positions shown; findings below may reference images not displayed]

FINDINGS: The heart size and mediastinal contours are within normal limits.
Both lungs are clear. The visualized skeletal structures are
unremarkable.
IMPRESSION: No active cardiopulmonary disease.

## 2020-06-12 ENCOUNTER — Other Ambulatory Visit: Payer: Self-pay | Admitting: Family Medicine

## 2020-06-12 DIAGNOSIS — E1369 Other specified diabetes mellitus with other specified complication: Secondary | ICD-10-CM
